# Patient Record
Sex: Male | Born: 1990 | Race: Black or African American | Hispanic: No | Marital: Single | Smoking: Current every day smoker
Health system: Southern US, Community
[De-identification: ages and names within clinical notes are randomized; demographics above are authoritative.]

## PROBLEM LIST (undated history)

## (undated) HISTORY — PX: CARDIAC SURGERY: SHX584

---
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
## 2014-05-09 ENCOUNTER — Emergency Department (HOSPITAL_COMMUNITY)
Admission: EM | Admit: 2014-05-09 | Discharge: 2014-05-09 | Disposition: A | Discharge status: Home / Self Care | Payer: No Typology Code available for payment source | Attending: Emergency Medicine | Admitting: Emergency Medicine

## 2014-05-09 ENCOUNTER — Encounter (HOSPITAL_COMMUNITY): Payer: Self-pay | Admitting: Family Medicine

## 2014-05-09 DIAGNOSIS — S3992XA Unspecified injury of lower back, initial encounter: Secondary | ICD-10-CM | POA: Diagnosis present

## 2014-05-09 DIAGNOSIS — M545 Low back pain, unspecified: Secondary | ICD-10-CM

## 2014-05-09 DIAGNOSIS — Y9389 Activity, other specified: Secondary | ICD-10-CM | POA: Insufficient documentation

## 2014-05-09 DIAGNOSIS — Y92410 Unspecified street and highway as the place of occurrence of the external cause: Secondary | ICD-10-CM | POA: Insufficient documentation

## 2014-05-09 DIAGNOSIS — Z720 Tobacco use: Secondary | ICD-10-CM | POA: Insufficient documentation

## 2014-05-09 DIAGNOSIS — Y998 Other external cause status: Secondary | ICD-10-CM | POA: Diagnosis not present

## 2014-05-09 DIAGNOSIS — Z9889 Other specified postprocedural states: Secondary | ICD-10-CM | POA: Insufficient documentation

## 2014-05-09 MED ORDER — TRAMADOL HCL 50 MG PO TABS: 50.0000 mg | ORAL_TABLET | Freq: Four times a day (QID) | ORAL | Status: DC | PRN

## 2014-05-09 MED ORDER — IBUPROFEN 600 MG PO TABS: 600.0000 mg | ORAL_TABLET | Freq: Four times a day (QID) | ORAL | Status: DC | PRN

## 2014-05-09 MED ORDER — CYCLOBENZAPRINE HCL 10 MG PO TABS: 10.0000 mg | ORAL_TABLET | Freq: Two times a day (BID) | ORAL | Status: DC | PRN

## 2014-05-09 NOTE — ED Notes (Signed)
Pt made aware to return if symptoms worsen or if any life threatening symptoms occur.   
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
## 2014-08-31 ENCOUNTER — Emergency Department (HOSPITAL_COMMUNITY)
Admission: EM | Admit: 2014-08-31 | Discharge: 2014-08-31 | Disposition: A | Discharge status: Home / Self Care | Payer: Self-pay | Attending: Emergency Medicine | Admitting: Emergency Medicine

## 2014-08-31 ENCOUNTER — Encounter (HOSPITAL_COMMUNITY): Payer: Self-pay | Admitting: *Deleted

## 2014-08-31 DIAGNOSIS — Y9289 Other specified places as the place of occurrence of the external cause: Secondary | ICD-10-CM | POA: Insufficient documentation

## 2014-08-31 DIAGNOSIS — S41012A Laceration without foreign body of left shoulder, initial encounter: Secondary | ICD-10-CM | POA: Insufficient documentation

## 2014-08-31 DIAGNOSIS — Y998 Other external cause status: Secondary | ICD-10-CM | POA: Insufficient documentation

## 2014-08-31 DIAGNOSIS — S21212A Laceration without foreign body of left back wall of thorax without penetration into thoracic cavity, initial encounter: Secondary | ICD-10-CM

## 2014-08-31 DIAGNOSIS — Y9389 Activity, other specified: Secondary | ICD-10-CM | POA: Insufficient documentation

## 2014-08-31 DIAGNOSIS — S21112A Laceration without foreign body of left front wall of thorax without penetration into thoracic cavity, initial encounter: Secondary | ICD-10-CM | POA: Insufficient documentation

## 2014-08-31 DIAGNOSIS — S31119A Laceration without foreign body of abdominal wall, unspecified quadrant without penetration into peritoneal cavity, initial encounter: Secondary | ICD-10-CM | POA: Insufficient documentation

## 2014-08-31 DIAGNOSIS — Z720 Tobacco use: Secondary | ICD-10-CM | POA: Insufficient documentation

## 2014-08-31 NOTE — Discharge Instructions (Signed)
Keep wounds clean and dry. Let the sterri strips fall of on their own. Follow up with your doctor as needed.   Sterile Tape Wound Care Some cuts and wounds can be closed using sterile tape, also called skin adhesive strips. Skin adhesive strips can be used for shallow (superficial) and simple cuts, wounds, lacerations, and surgical incisions. These strips act in place of stitches to hold the edges of the wound together, allowing for faster healing. Unlike stitches, the adhesive strips do not require needles or anesthetic medicine for placement. The strips will wear off naturally as the wound is healing. It is important to take proper care of your wound at home while it heals.  HOME CARE INSTRUCTIONS  Try to keep the area around your wound clean and dry. Do not allow the adhesive strips to get wet for the first 12 hours.   Do not use any soaps or ointments on the wound for the first 12 hours.   If a bandage (dressing) has been applied, follow your health care provider's instructions for how often to change the dressing. Keep the dressing dry if one has been applied.   Do not remove the adhesive strips. They will fall off on their own. If they do not, you may remove them gently after 10 days. You should gently wet the strips before removing them. For example, this can be done in the shower.  Do not scratch, pick, or rub the wound area.   Protect the wound from further injury until it is healed.   Protect the wound from sun and tanning bed exposure while it is healing and for several weeks after healing.   Only take over-the-counter or prescription medicines as directed by your health care provider.   Keep all follow-up appointments as directed by your health care provider.  SEEK MEDICAL CARE IF: Your adhesive strips become wet or soaked with blood before the wound has healed. The tape will need to be replaced.  SEEK IMMEDIATE MEDICAL CARE IF:  You have increasing pain in the wound.    You develop a rash after the strips are applied.  Your wound becomes red, swollen, hot, or tender.   You have a red streak that goes away from the wound.   You have pus coming from the wound.   You have increased bleeding from the wound.  You notice a bad smell coming from the wound.   Your wound breaks open. MAKE SURE YOU:  Understand these instructions.  Will watch your condition.  Will get help right away if you are not doing well or get worse. Document Released: 03/14/2004 Document Revised: 11/25/2012 Document Reviewed: 08/26/2012 [facility] Patient Information 2015 [employer], [place]. This information is not intended to replace advice given to you by your health care provider. Make sure you discuss any questions you have with your health care provider.
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
## 2014-10-26 ENCOUNTER — Emergency Department (HOSPITAL_COMMUNITY)
Admission: EM | Admit: 2014-10-26 | Discharge: 2014-10-26 | Disposition: A | Discharge status: Home / Self Care | Payer: Self-pay | Attending: Emergency Medicine | Admitting: Emergency Medicine

## 2014-10-26 ENCOUNTER — Encounter (HOSPITAL_COMMUNITY): Payer: Self-pay | Admitting: *Deleted

## 2014-10-26 ENCOUNTER — Emergency Department (HOSPITAL_COMMUNITY): Payer: Self-pay

## 2014-10-26 DIAGNOSIS — W458XXA Other foreign body or object entering through skin, initial encounter: Secondary | ICD-10-CM | POA: Insufficient documentation

## 2014-10-26 DIAGNOSIS — Y998 Other external cause status: Secondary | ICD-10-CM | POA: Insufficient documentation

## 2014-10-26 DIAGNOSIS — M795 Residual foreign body in soft tissue: Secondary | ICD-10-CM

## 2014-10-26 DIAGNOSIS — Y9289 Other specified places as the place of occurrence of the external cause: Secondary | ICD-10-CM | POA: Insufficient documentation

## 2014-10-26 DIAGNOSIS — Y9389 Activity, other specified: Secondary | ICD-10-CM | POA: Insufficient documentation

## 2014-10-26 DIAGNOSIS — Z720 Tobacco use: Secondary | ICD-10-CM | POA: Insufficient documentation

## 2014-10-26 DIAGNOSIS — S51821A Laceration with foreign body of right forearm, initial encounter: Secondary | ICD-10-CM | POA: Insufficient documentation

## 2014-10-26 MED ORDER — LIDOCAINE HCL 2 % IJ SOLN
10.0000 mL | Freq: Once | INTRAMUSCULAR | Status: AC
  Administered 2014-10-26: 200 mg
  Filled 2014-10-26: qty 20

## 2014-10-26 MED ORDER — LIDOCAINE HCL 1 % IJ SOLN
20.0000 mL | Freq: Once | INTRAMUSCULAR | Status: DC
  Filled 2014-10-26: qty 20

## 2014-10-26 MED ORDER — CEPHALEXIN 500 MG PO CAPS: 500.0000 mg | ORAL_CAPSULE | Freq: Two times a day (BID) | ORAL | Status: DC

## 2014-10-26 NOTE — ED Notes (Signed)
PT reports when he was moving wood a piece became lodged in hi anterior fore arm.

## 2014-10-26 NOTE — ED Notes (Signed)
Declined W/C at D/C and was escorted to lobby by RN. 

## 2014-10-26 NOTE — ED Provider Notes (Signed)
CSN: [id number]     Arrival date & time 10/26/14  4098 History  This chart was scribed for non-physician practitioner, Eyvonne Mechanic, PA-C working with Azalia Bilis, MD by Gwenyth Ober, ED scribe. This patient was seen in room TR06C/TR06C and the patient's care was started at 10:11 AM   Chief Complaint  Patient presents with  . Arm Injury   The history is provided by the patient. No language interpreter was used.   HPI Comments: Shawn Dunlap is a 24 y.o. male who presents to the Emergency Department complaining of a piece of wood lodged in his distal right forearm that occurred 1 hour ago. Pt reports injury occurred while he was moving a piece of plywood in his yard. He had a tetanus vaccine 2 years ago. Pt does not take any regular medication. He denies a history of DM. Pt also denies numbness as an associated symptom.  No PCP  History reviewed. No pertinent past medical history. Past Surgical History  Procedure Laterality Date  . 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   History reviewed. No pertinent family history. Social History  Substance Use Topics  . Smoking status: Current Every Day Smoker    Types: Cigarettes  . Smokeless tobacco: Never Used  . Alcohol Use: No    Review of Systems  All other systems reviewed and are negative.     Allergies  Review of patient's allergies indicates no known allergies.  Home Medications   Prior to Admission medications   Medication Sig Start Date End Date Taking? Authorizing Provider  cephALEXin (KEFLEX) 500 MG capsule Take 1 capsule (500 mg total) by mouth 2 (two) times daily. 10/26/14   Eyvonne Mechanic, PA-C  cyclobenzaprine (FLEXERIL) 10 MG tablet Take 1 tablet (10 mg total) by mouth 2 (two) times daily as needed for muscle spasms. 05/09/14   Junius Finner, PA-C  ibuprofen (ADVIL,MOTRIN) 600 MG tablet Take 1 tablet (600 mg total) by mouth every 6 (six) hours as needed. 05/09/14   Junius Finner, PA-C   traMADol (ULTRAM) 50 MG tablet Take 1 tablet (50 mg total) by mouth every 6 (six) hours as needed. 05/09/14   Junius Finner, PA-C   BP 120/75 mmHg  Pulse 51  Temp(Src) 97.6 F (36.4 C) (Oral)  Resp 16  SpO2 100%   Physical Exam  Constitutional: He appears well-developed and well-nourished. No distress.  HENT:  Head: Normocephalic and atraumatic.  Eyes: Conjunctivae and EOM are normal.  Neck: Neck supple. No tracheal deviation present.  Cardiovascular: Normal rate.   Pulmonary/Chest: Effort normal. No respiratory distress.  Musculoskeletal:  2-1/2 cm splinter in right distal wrist, no obvious bleeding, no obvious signs of infection. Distal sensation intact, cap refill less than 3 seconds, good strength 5 out of 5  Skin: Skin is warm and dry.  Psychiatric: He has a normal mood and affect. His behavior is normal.  Nursing note and vitals reviewed.   ED Course  Procedures   DIAGNOSTIC STUDIES: Oxygen Saturation is 99% on RA, normal by my interpretation.    COORDINATION OF CARE: 10:15 AM Discussed treatment plan with pt which includes removal of the foreign body. Pt agreed to plan.  11:28 AM  REMOVAL OF A FOREIGN BODY Performed by: Eyvonne Mechanic, PA-C Consent: Verbal consent obtained. Risks and benefits: risks, benefits and alternatives were discussed Patient identity confirmed: provided demographic data Time out performed prior to procedure Prepped and Draped in  normal sterile fashion Wound explored, foreign body (wood) identified Location: Right distal forearm Length: 2.5 cm Anesthesia: local infiltration Local anesthetic: lidocaine 2% without epinephrine Anesthetic total: 2 ml Irrigation method: syringe Amount of cleaning: standard Technique: 1 cm incision made, entirety of foreign body grasped with scissors and removed. No remaining foreign bodies visualized or palpated.  Patient tolerance: Patient tolerated the procedure well with no immediate  complications.  11:52 AM LACERATION REPAIR Performed by: Eyvonne Mechanic, PA-C Consent: Verbal consent obtained. Risks and benefits: risks, benefits and alternatives were discussed Patient identity confirmed: provided demographic data Time out performed prior to procedure Prepped and Draped in normal sterile fashion Wound explored Laceration Location: Right distal forearm Laceration Length: 1 cm No Foreign Bodies seen or palpated Anesthesia: local infiltration Local anesthetic: lidocaine 2% without epinephrine (administered during foreign body removal) Irrigation method: syringe Amount of cleaning: standard Skin closure: Edges well-approximated Number of sutures or staples: 3 4-0 Vicryl Rapide Technique: Simple interrupted Patient tolerance: Patient tolerated the procedure well with no immediate complications.   Labs Review Labs Reviewed - No data to display  Imaging Review Dg Wrist Complete Right  10/26/2014   CLINICAL DATA:  Penetrating injury with wood.  Rule out foreign body  EXAM: RIGHT WRIST - COMPLETE 3+ VIEW  COMPARISON:  None.  FINDINGS: Normal alignment no fracture. No radiopaque foreign body. There is mild soft tissue swelling ventrally proximal to the wrist joint which could be hematoma. Note that wood may be impossible see on x-ray.  IMPRESSION: Negative for foreign body.  No significant bone abnormality.   Electronically Signed   By: Marlan Palau M.D.   On: 10/26/2014 13:10     EKG Interpretation None      MDM   Final diagnoses:  Foreign body (FB) in soft tissue   Labs:    Imaging: X-ray right wrist  Consults:   Therapeutics: Lidocaine  Discharge Meds: Keflex  Assessment/Plan: Patient presents with a foreign body, minor incision was made to remove the foreign body, plain film showed no remaining foreign body. Patient was sutured, placed on prophylactic antibiotics. He is given strict return precautions.   I personally performed the services  described in this documentation, which was scribed in my presence. The recorded information has been reviewed and is accurate.    Eyvonne Mechanic, PA-C 10/26/14 1505  Azalia Bilis, MD 10/26/14 [phone]

## 2014-10-26 NOTE — Discharge Instructions (Signed)
Please monitor for signs of infection, return immediately if any present. Please use antibiotics as directed.

## 2014-11-10 ENCOUNTER — Emergency Department (HOSPITAL_COMMUNITY)
Admission: EM | Admit: 2014-11-10 | Discharge: 2014-11-11 | Disposition: A | Discharge status: Home / Self Care | Payer: No Typology Code available for payment source | Attending: Emergency Medicine | Admitting: Emergency Medicine

## 2014-11-10 ENCOUNTER — Encounter (HOSPITAL_COMMUNITY): Payer: Self-pay | Admitting: *Deleted

## 2014-11-10 DIAGNOSIS — M7918 Myalgia, other site: Secondary | ICD-10-CM

## 2014-11-10 DIAGNOSIS — Y92410 Unspecified street and highway as the place of occurrence of the external cause: Secondary | ICD-10-CM | POA: Diagnosis not present

## 2014-11-10 DIAGNOSIS — Y9389 Activity, other specified: Secondary | ICD-10-CM | POA: Diagnosis not present

## 2014-11-10 DIAGNOSIS — Z792 Long term (current) use of antibiotics: Secondary | ICD-10-CM | POA: Diagnosis not present

## 2014-11-10 DIAGNOSIS — Z720 Tobacco use: Secondary | ICD-10-CM | POA: Diagnosis not present

## 2014-11-10 DIAGNOSIS — S4991XA Unspecified injury of right shoulder and upper arm, initial encounter: Secondary | ICD-10-CM | POA: Insufficient documentation

## 2014-11-10 DIAGNOSIS — S0990XA Unspecified injury of head, initial encounter: Secondary | ICD-10-CM | POA: Diagnosis not present

## 2014-11-10 DIAGNOSIS — S8992XA Unspecified injury of left lower leg, initial encounter: Secondary | ICD-10-CM | POA: Insufficient documentation

## 2014-11-10 DIAGNOSIS — Z9889 Other specified postprocedural states: Secondary | ICD-10-CM | POA: Diagnosis not present

## 2014-11-10 DIAGNOSIS — Y999 Unspecified external cause status: Secondary | ICD-10-CM | POA: Insufficient documentation

## 2014-11-10 MED ORDER — METHOCARBAMOL 500 MG PO TABS: 500.0000 mg | ORAL_TABLET | Freq: Two times a day (BID) | ORAL | Status: DC

## 2014-11-10 MED ORDER — HYDROCODONE-ACETAMINOPHEN 5-325 MG PO TABS
2.0000 | ORAL_TABLET | Freq: Once | ORAL | Status: AC
  Administered 2014-11-11: 2 via ORAL
  Filled 2014-11-10: qty 2

## 2014-11-10 MED ORDER — CYCLOBENZAPRINE HCL 10 MG PO TABS
5.0000 mg | ORAL_TABLET | Freq: Once | ORAL | Status: AC
  Administered 2014-11-11: 5 mg via ORAL
  Filled 2014-11-10: qty 1

## 2014-11-10 MED ORDER — NAPROXEN 500 MG PO TABS: 500.0000 mg | ORAL_TABLET | Freq: Two times a day (BID) | ORAL | Status: DC
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
## 2014-11-10 NOTE — ED Notes (Signed)
PA at bedside.
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
## 2017-11-03 ENCOUNTER — Encounter (HOSPITAL_COMMUNITY): Payer: Self-pay

## 2017-11-03 ENCOUNTER — Other Ambulatory Visit: Payer: Self-pay

## 2017-11-03 ENCOUNTER — Emergency Department (HOSPITAL_COMMUNITY)
Admission: EM | Admit: 2017-11-03 | Discharge: 2017-11-03 | Disposition: A | Discharge status: Home / Self Care | Payer: No Typology Code available for payment source | Attending: Emergency Medicine | Admitting: Emergency Medicine

## 2017-11-03 DIAGNOSIS — Y929 Unspecified place or not applicable: Secondary | ICD-10-CM | POA: Insufficient documentation

## 2017-11-03 DIAGNOSIS — Z79899 Other long term (current) drug therapy: Secondary | ICD-10-CM | POA: Diagnosis not present

## 2017-11-03 DIAGNOSIS — Y999 Unspecified external cause status: Secondary | ICD-10-CM | POA: Diagnosis not present

## 2017-11-03 DIAGNOSIS — F17210 Nicotine dependence, cigarettes, uncomplicated: Secondary | ICD-10-CM | POA: Insufficient documentation

## 2017-11-03 DIAGNOSIS — S39012A Strain of muscle, fascia and tendon of lower back, initial encounter: Secondary | ICD-10-CM | POA: Insufficient documentation

## 2017-11-03 DIAGNOSIS — Y9389 Activity, other specified: Secondary | ICD-10-CM | POA: Diagnosis not present

## 2017-11-03 MED ORDER — ORPHENADRINE CITRATE ER 100 MG PO TB12: 100.0000 mg | ORAL_TABLET | Freq: Two times a day (BID) | ORAL | 0 refills | Status: AC

## 2017-11-03 MED ORDER — MELOXICAM 7.5 MG PO TABS: 7.5000 mg | ORAL_TABLET | Freq: Every day | ORAL | 0 refills | Status: AC

## 2017-11-03 NOTE — Discharge Instructions (Addendum)
Take meloxicam and Norflex as needed as prescribed for muscle soreness.  Do not drive if taking Norflex. Apply warm compresses to sore muscles for 20 minutes at a time. Follow-up with primary care provider, referral given if needed.
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
## 2020-07-12 ENCOUNTER — Ambulatory Visit (INDEPENDENT_AMBULATORY_CARE_PROVIDER_SITE_OTHER): Payer: Self-pay

## 2020-07-12 ENCOUNTER — Other Ambulatory Visit: Payer: Self-pay

## 2020-07-12 ENCOUNTER — Ambulatory Visit (HOSPITAL_COMMUNITY)
Admission: EM | Admit: 2020-07-12 | Discharge: 2020-07-12 | Disposition: A | Discharge status: Home / Self Care | Payer: Self-pay | Attending: Emergency Medicine | Admitting: Emergency Medicine

## 2020-07-12 ENCOUNTER — Encounter (HOSPITAL_COMMUNITY): Payer: Self-pay

## 2020-07-12 DIAGNOSIS — L03032 Cellulitis of left toe: Secondary | ICD-10-CM

## 2020-07-12 DIAGNOSIS — S91302A Unspecified open wound, left foot, initial encounter: Secondary | ICD-10-CM

## 2020-07-12 DIAGNOSIS — W208XXA Other cause of strike by thrown, projected or falling object, initial encounter: Secondary | ICD-10-CM

## 2020-07-12 DIAGNOSIS — S9032XA Contusion of left foot, initial encounter: Secondary | ICD-10-CM

## 2020-07-12 DIAGNOSIS — M79672 Pain in left foot: Secondary | ICD-10-CM

## 2020-07-12 MED ORDER — TRAMADOL HCL 50 MG PO TABS: 50.0000 mg | ORAL_TABLET | Freq: Four times a day (QID) | ORAL | 0 refills | Status: DC | PRN

## 2020-07-12 MED ORDER — CEPHALEXIN 500 MG PO CAPS: 500.0000 mg | ORAL_CAPSULE | Freq: Two times a day (BID) | ORAL | 0 refills | Status: DC

## 2020-07-12 MED ORDER — CEPHALEXIN 500 MG PO CAPS: 500.0000 mg | ORAL_CAPSULE | Freq: Four times a day (QID) | ORAL | 0 refills | Status: DC

## 2020-07-12 NOTE — Discharge Instructions (Signed)
You will need to follow up with podiatry for foot injury  We do not see an fracture to foot but you do have a piece of metal in the area of injury.  You will need to take antibiotics full dose if redness is not better in 48 hours you will need to return to urgent care for follow up  Clean area wash dry keep covered  Wear boot for 7 days or until healed
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
## 2020-07-12 NOTE — ED Triage Notes (Signed)
Pt reports swelling, pain in the left foot x 3-4 days after a table fell over the left foot.  Pt states some skin ripped off when the table smash his foot.

## 2020-08-10 ENCOUNTER — Emergency Department (HOSPITAL_COMMUNITY): Payer: Self-pay

## 2020-08-10 ENCOUNTER — Encounter (HOSPITAL_COMMUNITY): Payer: Self-pay | Admitting: *Deleted

## 2020-08-10 ENCOUNTER — Emergency Department (HOSPITAL_COMMUNITY)
Admission: EM | Admit: 2020-08-10 | Discharge: 2020-08-10 | Disposition: A | Discharge status: Home / Self Care | Payer: Self-pay | Attending: Emergency Medicine | Admitting: Emergency Medicine

## 2020-08-10 ENCOUNTER — Other Ambulatory Visit: Payer: Self-pay

## 2020-08-10 DIAGNOSIS — W3400XA Accidental discharge from unspecified firearms or gun, initial encounter: Secondary | ICD-10-CM | POA: Insufficient documentation

## 2020-08-10 DIAGNOSIS — S91102A Unspecified open wound of left great toe without damage to nail, initial encounter: Secondary | ICD-10-CM | POA: Insufficient documentation

## 2020-08-10 DIAGNOSIS — F17210 Nicotine dependence, cigarettes, uncomplicated: Secondary | ICD-10-CM | POA: Insufficient documentation

## 2020-08-10 DIAGNOSIS — Z23 Encounter for immunization: Secondary | ICD-10-CM | POA: Insufficient documentation

## 2020-08-10 DIAGNOSIS — S91132A Puncture wound without foreign body of left great toe without damage to nail, initial encounter: Secondary | ICD-10-CM

## 2020-08-10 MED ORDER — TETANUS-DIPHTH-ACELL PERTUSSIS 5-2.5-18.5 LF-MCG/0.5 IM SUSY
0.5000 mL | PREFILLED_SYRINGE | Freq: Once | INTRAMUSCULAR | Status: AC
Start: 2020-08-10 — End: 2020-08-10
  Administered 2020-08-10: 0.5 mL via INTRAMUSCULAR
  Filled 2020-08-10: qty 0.5

## 2020-08-10 MED ORDER — LIDOCAINE HCL (PF) 1 % IJ SOLN
10.0000 mL | Freq: Once | INTRAMUSCULAR | Status: AC
Start: 2020-08-10 — End: 2020-08-10
  Administered 2020-08-10: 10 mL
  Filled 2020-08-10: qty 30

## 2020-08-10 MED ORDER — ACETAMINOPHEN 500 MG PO TABS
1000.0000 mg | ORAL_TABLET | Freq: Once | ORAL | Status: AC
  Administered 2020-08-10: 1000 mg via ORAL
  Filled 2020-08-10: qty 2

## 2020-08-10 MED ORDER — BACITRACIN ZINC 500 UNIT/GM EX OINT
TOPICAL_OINTMENT | Freq: Two times a day (BID) | CUTANEOUS | Status: DC
  Filled 2020-08-10: qty 0.9

## 2020-08-10 MED ORDER — CEPHALEXIN 500 MG PO CAPS: 500.0000 mg | ORAL_CAPSULE | Freq: Three times a day (TID) | ORAL | 0 refills | Status: AC
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
## 2022-06-27 ENCOUNTER — Ambulatory Visit (HOSPITAL_COMMUNITY)
Admission: EM | Admit: 2022-06-27 | Discharge: 2022-06-27 | Disposition: A | Discharge status: Home / Self Care | Payer: Self-pay | Attending: Family Medicine | Admitting: Family Medicine

## 2022-06-27 ENCOUNTER — Other Ambulatory Visit: Payer: Self-pay

## 2022-06-27 ENCOUNTER — Ambulatory Visit (INDEPENDENT_AMBULATORY_CARE_PROVIDER_SITE_OTHER): Payer: Self-pay

## 2022-06-27 ENCOUNTER — Encounter (HOSPITAL_COMMUNITY): Payer: Self-pay | Admitting: Emergency Medicine

## 2022-06-27 DIAGNOSIS — S61239A Puncture wound without foreign body of unspecified finger without damage to nail, initial encounter: Secondary | ICD-10-CM

## 2022-06-27 DIAGNOSIS — M79645 Pain in left finger(s): Secondary | ICD-10-CM

## 2022-06-27 MED ORDER — IBUPROFEN 800 MG PO TABS: 800.0000 mg | ORAL_TABLET | Freq: Three times a day (TID) | ORAL | 0 refills | Status: DC

## 2022-06-27 MED ORDER — CEPHALEXIN 500 MG PO CAPS: 500.0000 mg | ORAL_CAPSULE | Freq: Two times a day (BID) | ORAL | 0 refills | Status: DC

## 2022-06-27 NOTE — ED Provider Notes (Signed)
[facility]   [id number] 06/27/22 Arrival Time: 1708  ASSESSMENT & PLAN:  1. Pain in finger of left hand   2. Puncture wound of finger, initial encounter    I have personally viewed and independently interpreted the imaging studies ordered this visit. No bony abnormality to L 2nd finger films.  Discharge Medication List as of 06/27/2022  6:18 PM     START taking these medications   Details  cephALEXin (KEFLEX) 500 MG capsule Take 1 capsule (500 mg total) by mouth 2 (two) times daily., Starting Thu 06/27/2022, Normal      Empiric tx to avoid infection given puncture through and through wound.  Orders Placed This Encounter  Procedures   DG Finger Index Left   Work/school excuse note: provided. Recommend:  Follow-up Information     [facility].   Specialty: Urgent Care Why: If worsening or failing to improve as anticipated. Contact information: [address] [place] [address] [phone]                Reviewed expectations re: course of current medical issues. Questions answered. Outlined signs and symptoms indicating need for more acute intervention. Patient verbalized understanding. After Visit Summary given.  SUBJECTIVE: History from: patient. Shawn Dunlap is a 32 y.o. male who reports Left index finger injury..  patient reports he accidentally drove a nail into left index finger and had to drill to removed from finger.  2 sites, one entry site to pad of finger and exit through finger nail.  Patient was hanging dry wall.    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      OBJECTIVE:  Vitals:   06/27/22 1732  BP: 133/80  Pulse: 65  Resp: 18  Temp: 98.1 F (36.7 C)  TempSrc: Oral  SpO2: 97%    General appearance: alert; no distress HEENT: [place]; AT Neck: supple with  FROM Resp: unlabored respirations Extremities: L 2nd finger: warm with well perfused appearance; through and through puncture wound to distal left 2nd finger; does involve nail that is still intact; finger ROM: normal CV: brisk extremity capillary refill of LUE; 2+ radial pulse of LUE. Skin: warm and dry; no visible rashes Neurologic: gait normal; normal sensation and strength of LUE Psychological: alert and cooperative; normal mood and affect  Imaging: DG Finger Index Left  Result Date: 06/27/2022 CLINICAL DATA:  Trauma, nail through finger EXAM: LEFT INDEX FINGER 2+V COMPARISON:  None Available. FINDINGS: Frontal, oblique, lateral views of the left second digit are obtained. No acute displaced fracture, subluxation, or dislocation. No radiopaque foreign bodies. Joint spaces are well preserved. Soft tissues are grossly unremarkable. IMPRESSION: 1. No fracture or radiopaque foreign body. Electronically Signed   By: Sharlet Salina M.D.   On: 06/27/2022 18:06      No Known Allergies  History reviewed. No pertinent past medical history. Social History   Socioeconomic History   Marital status: Single    Spouse name: Not on file   Number of children: Not on file   Years of education: Not on file   Highest education level: Not on file  Occupational History   Not on file  Tobacco Use   Smoking status: Every Day    Types: Cigarettes   Smokeless tobacco: Never  Vaping Use   Vaping  Use: Every day  Substance and Sexual Activity   Alcohol use: Not Currently   Drug use: No   Sexual activity: Not on file  Other Topics Concern   Not on file  Social History Narrative   Not on file   Social Determinants of Health   Financial Resource Strain: Not on file  Food Insecurity: Not on file  Transportation Needs: Not on file  Physical Activity: Not on file  Stress: Not on file  Social Connections: Not on file   Family History  Problem Relation Age of Onset   Healthy Mother    Past  Surgical History:  Procedure Laterality Date   CARDIAC SURGERY     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       Mardella Layman, MD 06/27/22 2008

## 2022-06-27 NOTE — ED Triage Notes (Addendum)
Left index finger injury..  patient reports he accidentally drove a nail into left index finger and had to drill to removed from finger.  2 sites, one entry site to pad of finger and exit through finger nail.  Patient was hanging dry wall.    Last tetanus was February 2023  Patient has a random complaint of right shoulder soreness after shooting basketballs

## 2023-11-11 ENCOUNTER — Ambulatory Visit
Admission: EM | Admit: 2023-11-11 | Discharge: 2023-11-11 | Disposition: A | Discharge status: Home / Self Care | Attending: Family Medicine | Admitting: Family Medicine

## 2023-11-11 DIAGNOSIS — L03315 Cellulitis of perineum: Secondary | ICD-10-CM

## 2023-11-11 MED ORDER — AMOXICILLIN-POT CLAVULANATE 875-125 MG PO TABS: 1.0000 | ORAL_TABLET | Freq: Two times a day (BID) | ORAL | 0 refills | Status: DC

## 2023-11-11 MED ORDER — IBUPROFEN 800 MG PO TABS: 800.0000 mg | ORAL_TABLET | Freq: Three times a day (TID) | ORAL | 0 refills | Status: DC | PRN

## 2023-11-11 MED ORDER — IBUPROFEN 800 MG PO TABS: 800.0000 mg | ORAL_TABLET | Freq: Three times a day (TID) | ORAL | 0 refills | Status: AC | PRN

## 2023-11-11 NOTE — ED Notes (Signed)
 Patient undressed for provider

## 2023-11-11 NOTE — ED Provider Notes (Signed)
 [facility]    CSN: [id number] Arrival date & time: 11/11/23  1532      History   Chief Complaint Chief Complaint  Patient presents with   Skin Problem    HPI Shawn Dunlap is a 33 y.o. male.   HPI Here for 3 to 4-day history of pain in his perineum.  It is gotten worse since it began.  No fever and no drainage from the area.  NKDA   History reviewed. No pertinent past medical history.  There are no active problems to display for this patient.   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       Home Medications    Prior to Admission medications   Medication Sig Start Date End Date Taking? Authorizing Provider  amoxicillin -clavulanate (AUGMENTIN ) 875-125 MG tablet Take 1 tablet by mouth 2 (two) times daily for 7 days. 11/11/23 11/18/23  Vonna Sharlet POUR, MD  ibuprofen  (ADVIL ) 800 MG tablet Take 1 tablet (800 mg total) by mouth every 8 (eight) hours as needed (pain). 11/11/23   Vonna Sharlet POUR, MD    Family History Family History  Problem Relation Age of Onset   Healthy Mother     Social History Social History   Tobacco Use   Smoking status: Former    Types: Cigarettes   Smokeless tobacco: Never  Vaping Use   Vaping status: Every Day   Substances: Nicotine, Flavoring  Substance Use Topics   Alcohol use: Not Currently   Drug use: No     Allergies   Patient has no known allergies.   Review of Systems Review of Systems   Physical Exam Triage Vital Signs ED Triage Vitals  Encounter Vitals Group     BP 11/11/23 1716 112/79     Girls Systolic BP Percentile --      Girls Diastolic BP Percentile --      Boys Systolic BP Percentile --      Boys Diastolic BP Percentile --      Pulse Rate 11/11/23 1716 77     Resp 11/11/23 1716 18     Temp 11/11/23 1716 97.9 F (36.6 C)     Temp Source 11/11/23 1716 Oral     SpO2 11/11/23 1716 97 %     Weight  11/11/23 1715 195 lb (88.5 kg)     Height 11/11/23 1715 5' 10 (1.778 m)     Head Circumference --      Peak Flow --      Pain Score 11/11/23 1714 10     Pain Loc --      Pain Education --      Exclude from Growth Chart --    No data found.  Updated Vital Signs BP 112/79 (BP Location: Left Arm)   Pulse 77   Temp 97.9 F (36.6 C) (Oral)   Resp 18   Ht 5' 10 (1.778 m)   Wt 88.5 kg   SpO2 97%   BMI 27.98 kg/m   Visual Acuity Right Eye Distance:   Left Eye Distance:   Bilateral Distance:    Right Eye Near:   Left Eye Near:    Bilateral Near:     Physical Exam Vitals reviewed.  Constitutional:      General: He is not in acute distress.    Appearance: He is not ill-appearing, toxic-appearing or diaphoretic.  Genitourinary:    Comments: Chaperone is present during the time of exam.  There is induration and tenderness and erythema about 4 cm in diameter just posterior to the scrotum on the left side.  There is possibly some fluctuance in the anterior portion. Skin:    Coloration: Skin is not jaundiced or pale.  Neurological:     General: No focal deficit present.     Mental Status: He is alert and oriented to person, place, and time.  Psychiatric:        Behavior: Behavior normal.      UC Treatments / Results  Labs (all labs ordered are listed, but only abnormal results are displayed) Labs Reviewed - No data to display  EKG   Radiology No results found.  Procedures Procedures (including critical care time)  Medications Ordered in UC Medications - No data to display  Initial Impression / Assessment and Plan / UC Course  I have reviewed the triage vital signs and the nursing notes.  Pertinent labs & imaging results that were available during my care of the patient were reviewed by me and considered in my medical decision making (see chart for details).     We discussed risks and benefits of drainage procedure.  We decided to try draining the fluctuant  area.  He gave verbal consent Chaperone was present during the entire procedure.  Under clean conditions 1% lidocaine  plain is used to put infiltration anesthesia in.  #11 blade is used to make a stab wound in the anterior portion that was fluctuant.  Only blood was obtained. EBL 1 mL.  No complications.  Gauze was applied for pressure initially and then gauze was provided to him to put in his underwear to make a dressing.  Augmentin  and ibuprofen  are sent in for the infection and symptoms.  Final Clinical Impressions(s) / UC Diagnoses   Final diagnoses:  Cellulitis of perineum     Discharge Instructions      Take amoxicillin -clavulanate 875 mg--1 tab twice daily with food for 7 days   Take ibuprofen  800 mg--1 tab every 8 hours as needed for pain.  Warm sitz baths can help the circulation in the area  Ice actually might help the pain and turn    ED Prescriptions     Medication Sig Dispense Auth. Provider   ibuprofen  (ADVIL ) 800 MG tablet  (Status: Discontinued) Take 1 tablet (800 mg total) by mouth every 8 (eight) hours as needed (pain). 21 tablet Clydette Privitera K, MD   amoxicillin -clavulanate (AUGMENTIN ) 875-125 MG tablet  (Status: Discontinued) Take 1 tablet by mouth 2 (two) times daily for 7 days. 14 tablet Maritza Hosterman K, MD   amoxicillin -clavulanate (AUGMENTIN ) 875-125 MG tablet Take 1 tablet by mouth 2 (two) times daily for 7 days. 14 tablet Kamyrah Feeser, Sharlet POUR, MD   ibuprofen  (ADVIL ) 800 MG tablet Take 1 tablet (800 mg total) by mouth every 8 (eight) hours as needed (pain). 21 tablet Cristian Grieves K, MD      PDMP not reviewed this encounter.   Vonna Sharlet POUR, MD 11/11/23 2038

## 2023-11-11 NOTE — Discharge Instructions (Signed)
 Take amoxicillin -clavulanate 875 mg--1 tab twice daily with food for 7 days   Take ibuprofen  800 mg--1 tab every 8 hours as needed for pain.  Warm sitz baths can help the circulation in the area  Ice actually might help the pain and turn

## 2023-11-11 NOTE — ED Triage Notes (Signed)
 Attempted to call patient from care, no answer.

## 2023-11-11 NOTE — ED Triage Notes (Signed)
 Patient reports boil or abscess just below left cheek/buttocks. No fever. Has had this once before but was able to treat it at home and it went away.

## 2023-11-13 ENCOUNTER — Inpatient Hospital Stay (HOSPITAL_BASED_OUTPATIENT_CLINIC_OR_DEPARTMENT_OTHER)
Admission: EM | Admit: 2023-11-13 | Discharge: 2023-11-14 | DRG: 603 | Disposition: A | Discharge status: Home / Self Care | Attending: Surgery | Admitting: Surgery

## 2023-11-13 ENCOUNTER — Emergency Department (HOSPITAL_BASED_OUTPATIENT_CLINIC_OR_DEPARTMENT_OTHER)

## 2023-11-13 DIAGNOSIS — L02215 Cutaneous abscess of perineum: Secondary | ICD-10-CM | POA: Diagnosis present

## 2023-11-13 DIAGNOSIS — K611 Rectal abscess: Secondary | ICD-10-CM | POA: Diagnosis present

## 2023-11-13 DIAGNOSIS — K610 Anal abscess: Secondary | ICD-10-CM | POA: Diagnosis not present

## 2023-11-13 DIAGNOSIS — Z87891 Personal history of nicotine dependence: Secondary | ICD-10-CM | POA: Diagnosis not present

## 2023-11-13 LAB — BASIC METABOLIC PANEL WITH GFR
Anion gap: 10 (ref 5–15)
BUN: 9 mg/dL (ref 6–20)
CO2: 24 mmol/L (ref 22–32)
Calcium: 9.5 mg/dL (ref 8.9–10.3)
Chloride: 106 mmol/L (ref 98–111)
Creatinine, Ser: 0.9 mg/dL (ref 0.61–1.24)
GFR, Estimated: >60 mL/min (ref >60)
Glucose, Bld: 100 mg/dL — ABNORMAL HIGH (ref 70–99)
Potassium: 4.3 mmol/L (ref 3.5–5.1)
Sodium: 140 mmol/L (ref 135–145)

## 2023-11-13 LAB — CBC
HCT: 43.1 % (ref 39.0–52.0)
Hemoglobin: 14.3 g/dL (ref 13.0–17.0)
MCH: 30.1 pg (ref 26.0–34.0)
MCHC: 33.2 g/dL (ref 30.0–36.0)
MCV: 90.7 fL (ref 80.0–100.0)
Platelets: 202 K/uL (ref 150–400)
RBC: 4.75 MIL/uL (ref 4.22–5.81)
RDW: 11.1 % — ABNORMAL LOW (ref 11.5–15.5)
WBC: 8.5 K/uL (ref 4.0–10.5)
nRBC: 0 % (ref 0.0–0.2)

## 2023-11-13 MED ORDER — SODIUM CHLORIDE 0.9 % IV SOLN
1.0000 g | Freq: Once | INTRAVENOUS | Status: AC
  Administered 2023-11-13: 1 g via INTRAVENOUS
  Filled 2023-11-13: qty 10

## 2023-11-13 MED ORDER — CEFAZOLIN SODIUM-DEXTROSE 2-4 GM/100ML-% IV SOLN
2.0000 g | INTRAVENOUS | Status: AC
  Administered 2023-11-14: 2 g via INTRAVENOUS
  Filled 2023-11-13: qty 100

## 2023-11-13 MED ORDER — METRONIDAZOLE 500 MG/100ML IV SOLN
500.0000 mg | Freq: Once | INTRAVENOUS | Status: AC
  Administered 2023-11-13: 500 mg via INTRAVENOUS
  Filled 2023-11-13: qty 100

## 2023-11-13 MED ORDER — HYDROMORPHONE HCL 1 MG/ML IJ SOLN: 1.0000 mg | INTRAMUSCULAR | Status: DC | PRN

## 2023-11-13 MED ORDER — ONDANSETRON HCL 4 MG/2ML IJ SOLN: 4.0000 mg | Freq: Four times a day (QID) | INTRAMUSCULAR | Status: DC | PRN

## 2023-11-13 MED ORDER — IBUPROFEN 600 MG PO TABS: 600.0000 mg | ORAL_TABLET | Freq: Four times a day (QID) | ORAL | Status: DC | PRN

## 2023-11-13 MED ORDER — HYDROMORPHONE HCL 1 MG/ML IJ SOLN
1.0000 mg | INTRAMUSCULAR | Status: DC | PRN
  Administered 2023-11-14: 1 mg via INTRAVENOUS
  Filled 2023-11-13: qty 1

## 2023-11-13 MED ORDER — DEXTROSE-SODIUM CHLORIDE 5-0.9 % IV SOLN: INTRAVENOUS | Status: AC

## 2023-11-13 MED ORDER — CHLORHEXIDINE GLUCONATE CLOTH 2 % EX PADS: 6.0000 | MEDICATED_PAD | Freq: Once | CUTANEOUS | Status: AC

## 2023-11-13 MED ORDER — IBUPROFEN 800 MG PO TABS
800.0000 mg | ORAL_TABLET | Freq: Once | ORAL | Status: AC
  Administered 2023-11-13: 800 mg via ORAL
  Filled 2023-11-13: qty 1

## 2023-11-13 MED ORDER — ENOXAPARIN SODIUM 40 MG/0.4ML IJ SOSY
40.0000 mg | PREFILLED_SYRINGE | INTRAMUSCULAR | Status: DC
  Administered 2023-11-13: 40 mg via SUBCUTANEOUS
  Filled 2023-11-13: qty 0.4

## 2023-11-13 MED ORDER — IOHEXOL 300 MG/ML  SOLN
85.0000 mL | Freq: Once | INTRAMUSCULAR | Status: AC | PRN
  Administered 2023-11-13: 85 mL via INTRAVENOUS

## 2023-11-13 MED ORDER — ONDANSETRON 4 MG PO TBDP: 4.0000 mg | ORAL_TABLET | Freq: Four times a day (QID) | ORAL | Status: DC | PRN

## 2023-11-13 MED ORDER — CHLORHEXIDINE GLUCONATE CLOTH 2 % EX PADS
6.0000 | MEDICATED_PAD | Freq: Once | CUTANEOUS | Status: AC
  Administered 2023-11-13: 6 via TOPICAL

## 2023-11-13 MED ORDER — METRONIDAZOLE 500 MG/100ML IV SOLN
500.0000 mg | Freq: Two times a day (BID) | INTRAVENOUS | Status: DC
  Administered 2023-11-14: 500 mg via INTRAVENOUS
  Filled 2023-11-13: qty 100

## 2023-11-13 MED ORDER — ACETAMINOPHEN 500 MG PO TABS
1000.0000 mg | ORAL_TABLET | Freq: Four times a day (QID) | ORAL | Status: DC
  Administered 2023-11-14: 1000 mg via ORAL
  Filled 2023-11-13 (×2): qty 2

## 2023-11-13 MED ORDER — OXYCODONE HCL 5 MG PO TABS
5.0000 mg | ORAL_TABLET | ORAL | Status: DC | PRN
  Administered 2023-11-13 – 2023-11-14 (×3): 5 mg via ORAL
  Filled 2023-11-13 (×3): qty 1

## 2023-11-13 MED ORDER — SODIUM CHLORIDE 0.9 % IV SOLN: 2.0000 g | INTRAVENOUS | Status: DC

## 2023-11-13 NOTE — ED Provider Notes (Addendum)
 [facility] Provider Note   CSN: [id number] Arrival date & time: 11/13/23  1019     Patient presents with: Abscess   Shawn Dunlap is a 33 y.o. male.  33 year old male presents ED with complaints of abscess in the perineum.  Patient reports he went to urgent care on Tuesday and had it drained and was placed on Augmentin .  He reports he woke up this morning and felt like it was draining a lot in the bed took a bath and decided to come in for reassessment.  He reports significant pain but has been taking 800 mg ibuprofen  prescribed by urgent care with relief.  Patient denies any fevers, shortness of breath, chest pain, nausea, vomiting, diarrhea or any urinary symptoms.    Prior to Admission medications   Medication Sig Start Date End Date Taking? Authorizing Provider  amoxicillin -clavulanate (AUGMENTIN ) 875-125 MG tablet Take 1 tablet by mouth 2 (two) times daily for 7 days. 11/11/23 11/18/23 Yes Vonna Sharlet POUR, MD  ibuprofen  (ADVIL ) 800 MG tablet Take 1 tablet (800 mg total) by mouth every 8 (eight) hours as needed (pain). 11/11/23  Yes Vonna Sharlet POUR, MD    Allergies: Patient has no known allergies.    Review of Systems  Skin:  Positive for color change.  All other systems reviewed and are negative.   Updated Vital Signs BP 112/73 (BP Location: Left Arm)   Pulse 73   Temp 97.9 F (36.6 C) (Oral)   Resp 16   SpO2 100%   Physical Exam Vitals and nursing note reviewed.  Constitutional:      General: He is not in acute distress.    Appearance: Normal appearance. He is not ill-appearing.  HENT:     Head: Normocephalic and atraumatic.  Eyes:     Extraocular Movements: Extraocular movements intact.     Pupils: Pupils are equal, round, and reactive to light.  Cardiovascular:     Rate and Rhythm: Normal rate.  Pulmonary:     Effort: Pulmonary effort is normal. No respiratory distress.  Abdominal:     Tenderness: There is  no guarding.  Musculoskeletal:        General: Normal range of motion.     Cervical back: Normal range of motion.  Skin:    General: Skin is warm and dry.     Capillary Refill: Capillary refill takes less than 2 seconds.     Comments: Abscess in the perineum at the base of the left scrotum.  Neurological:     General: No focal deficit present.     Mental Status: He is alert.  Psychiatric:        Mood and Affect: Mood normal.        Behavior: Behavior normal.     (all labs ordered are listed, but only abnormal results are displayed) Labs Reviewed  BASIC METABOLIC PANEL WITH GFR - Abnormal; Notable for the following components:      Result Value   Glucose, Bld 100 (*)    All other components within normal limits  CBC - Abnormal; Notable for the following components:   RDW 11.1 (*)    All other components within normal limits    EKG: None  Radiology: CT PELVIS W CONTRAST Result Date: 11/13/2023 CLINICAL DATA:  Perianal abscess or fistula suspected attempted drainage EXAM: CT PELVIS WITH CONTRAST TECHNIQUE: Multidetector CT imaging of the pelvis was performed using the standard protocol following the bolus administration of intravenous  contrast. RADIATION DOSE REDUCTION: This exam was performed according to the departmental dose-optimization program which includes automated exposure control, adjustment of the mA and/or kV according to patient size and/or use of iterative reconstruction technique. CONTRAST:  85mL OMNIPAQUE  IOHEXOL  300 MG/ML  SOLN COMPARISON:  None Available. FINDINGS: Urinary Tract:  No abnormality visualized. Bowel:  Unremarkable visualized pelvic bowel loops. Vascular/Lymphatic: No pathologically enlarged lymph nodes. No significant vascular abnormality seen. Reproductive:  No mass or other significant abnormality Other: Elongated rim enhancing subcutaneous fluid collection at the left aspect of the gluteal cleft and perineum, anterior to the anus, measuring 4.9 x 2.0 x  4.1 cm (series 2, image 64, series 4, image 42). There is possibly a fistula tract extending to the sphincter complex although this is not well evaluated by CT. Musculoskeletal: No suspicious bone lesions identified. IMPRESSION: Elongated rim enhancing subcutaneous fluid collection at the left aspect of the gluteal cleft and perineum, anterior to the anus, measuring 4.9 x 2.0 x 4.1 cm. There is possibly a fistula tract extending to the sphincter complex although this is not well evaluated by CT. 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 Recommend multiphasic contrast enhanced fistula protocol pelvic MRI for further assessment. Electronically Signed   By: Marolyn JONETTA Jaksch M.D.   On: 11/13/2023 12:45     Procedures   Medications Ordered in the ED  metroNIDAZOLE  (FLAGYL ) IVPB 500 mg (500 mg Intravenous New Bag/Given 11/13/23 1546)  ibuprofen  (ADVIL ) tablet 800 mg (has no administration in time range)  iohexol  (OMNIPAQUE ) 300 MG/ML solution 85 mL (85 mLs Intravenous Contrast Given 11/13/23 1219)  cefTRIAXone  (ROCEPHIN ) 1 g in sodium chloride  0.9 % 100 mL IVPB (1 g Intravenous New Bag/Given 11/13/23 1512)    33 y.o. male presents to the ED with complaints of abscess, this involves an extensive number of treatment options, and is a complaint that carries with it a high risk of complications and morbidity.  The differential diagnosis includes abscess, cyst,  (Ddx)  On arrival pt is nontoxic, vitals unremarkable. Exam significant for abscess in the perineum near the left base of the scrotum.  I ordered medication Rocephin , Flagyl , Advil  for abscess and pain.  Lab Tests:  I Ordered, reviewed, and interpreted labs, which included: BMP, CBC  Imaging Studies ordered:  I ordered imaging studies which included CT pelvis with contrast, I independently visualized and interpreted imaging which showed extensive abscess in the perianal area with potential fistula.  ED Course:   33 year old  male presents ED with complaints of abscess in his perineum.  Patient reports on Tuesday he had it drained and was placed on Augmentin .  Patient reports this got worse and this morning he had significant pain and felt drainage while he was getting out of bed.  Took a bath and decided come in for evaluation.  Patient denies any fevers, shortness of breath, chest pain, nausea, vomiting, diarrhea, urinary symptoms.  Patient is nontoxic-appearing on exam and in no acute distress.  Patient has approximately 8 cm visible abscess in the perineum at the left base of the scrotum.  Pain to palpation to the abscess with mild pain to palpation to the base of the scrotum.  No pain to palpation on base of the buttocks.  Patient is afebrile and has no increased respiratory rate vitals are all unremarkable.  Pain has been managed well with 800 mg ibuprofen  prescribed by urgent care.  CT pelvis will be ordered to evaluate depth and severity of abscess.  CT  remarkable for a large abscess in the gluteal cleft and perineum with fistula.  MRI was recommended for further evaluation.  General surgery was consulted with recommendations of starting on IV antibiotics and admitting for further imaging and management.  I spoke with Marjorie Favre PA-C with surgery and her attending Dr. Vanderbilt will be admitting for further management of abscess.  Patient was started on antibiotics and ibuprofen  for pain.  Patient was advised of findings and treatment plan and was comfortable with treatment.   Portions of this note were generated with Scientist, clinical (histocompatibility and immunogenetics). Dictation errors may occur despite best attempts at proofreading.   Final diagnoses:  Abscess, perineum    ED Discharge Orders     None          Shawn Dunlap 11/13/23 1547    Shawn Dunlap 11/13/23 1549    Doretha Folks, MD 11/21/23 [phone]

## 2023-11-13 NOTE — ED Notes (Signed)
Shawn Dunlap with cl called for transport 

## 2023-11-13 NOTE — ED Triage Notes (Signed)
 Patient states abscess to perineum. Seen at urgent care and drainage was attempted but only blood came out. Taking Augmentin  since. Pain has worsened.

## 2023-11-13 NOTE — Consult Note (Addendum)
 Consult Note  Shawn Dunlap [birth year]  [id number].    Requesting MD:  Dr. 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   Patient had gone to urgent care on 9/23 due to pain and fullness in perineum and underwent an I&D. He was provided with course of Augmentin  at that time. Initially patient states there was no purulent drainage.  Earlier this morning, the patient reported that there was an increase in drainage that became purulent and felt that the area needed to be reassessed. Having significant pain that has been managed with 800 mg ibuprofen  prescribed by urgent care. He denied fevers, SOB, chest pain, n/v, diarrhea, or urinary symptoms.  Work up in the ED showed concerns for abscess of the left aspect of the gluteal cleft and perineum with possible anal fistula. BMP normal with the exception of glucose (100). No leukocytosis. Normal HGB. Patient transferred to [facility]. General surgery has been asked to consult.  Surgical history: Cardiac procedure at age 35 Daily prescription medications: Denies anticoagulation meds.    ROS: Per HPI  Family History  Problem Relation Age of Onset   Healthy Mother     No past medical history on file.  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    Social History:  reports that he has quit smoking. His smoking use included cigarettes. He has never used smokeless tobacco. He reports that he does not currently use alcohol. He reports that he does not use drugs.  Allergies: No Known Allergies  Medications Prior to Admission  Medication Sig Dispense Refill   amoxicillin -clavulanate (AUGMENTIN ) 875-125 MG tablet Take 1 tablet by mouth 2 (two)  times daily for 7 days. 14 tablet 0   ibuprofen  (ADVIL ) 800 MG tablet Take 1 tablet (800 mg total) by mouth every 8 (eight) hours as needed (pain). 21 tablet 0    Blood pressure 134/82, pulse 65, temperature 98 F (36.7 C), temperature source Oral, resp. rate 17, SpO2 98%. Physical Exam:  General: pleasant male in mild distress secondary to pain HEENT: head is normocephalic, atraumatic.  Sclera are noninjected.  PERRL.  Ears and nose without any masses or lesions.  Mouth is pink and moist Heart: regular, rate, and rhythm.  Lungs: Normal work of breathing on room air Abd: soft, NT, ND GU: Induration, fluctuance and drainage to left perineum and base of left scrotum, exam limited secondary to pain MS: all 4 extremities are symmetrical with no cyanosis, clubbing, or edema. Skin: warm and dry with no masses, lesions, or rashes Neuro: No focal neurologic deficits Psych: A&Ox3 with an appropriate affect.   Results for orders placed or performed during the hospital encounter of 11/13/23 (from the past 48 hours)  Basic metabolic panel     Status: Abnormal   Collection Time: 11/13/23 11:26 AM  Result Value Ref Range   Sodium 140 135 - 145 mmol/L   Potassium 4.3 3.5 - 5.1 mmol/L   Chloride 106 98 - 111 mmol/L   CO2 24 22 - 32 mmol/L   Glucose, Bld 100 (H) 70 - 99 mg/dL    Comment: Glucose reference range applies only to samples taken after fasting for  at least 8 hours.   BUN 9 6 - 20 mg/dL   Creatinine, Ser 9.09 0.61 - 1.24 mg/dL   Calcium 9.5 8.9 - 89.6 mg/dL   GFR, Estimated >39 >39 mL/min    Comment: (NOTE) Calculated using the CKD-EPI Creatinine Equation (2021)    Anion gap 10 5 - 15    Comment: Performed at [facility], [address] [place], [place] [address]  CBC     Status: Abnormal   Collection Time: 11/13/23 11:26 AM  Result Value Ref Range   WBC 8.5 4.0 - 10.5 K/uL   RBC 4.75 4.22 - 5.81 MIL/uL   Hemoglobin 14.3 13.0 - 17.0 g/dL   HCT 56.8 60.9 -  47.9 %   MCV 90.7 80.0 - 100.0 fL   MCH 30.1 26.0 - 34.0 pg   MCHC 33.2 30.0 - 36.0 g/dL   RDW 88.8 (L) 88.4 - 84.4 %   Platelets 202 150 - 400 K/uL   nRBC 0.0 0.0 - 0.2 %    Comment: Performed at [facility], [address], [place], [place] [address]   CT PELVIS W CONTRAST Result Date: 11/13/2023 CLINICAL DATA:  Perianal abscess or fistula suspected attempted drainage EXAM: CT PELVIS WITH CONTRAST TECHNIQUE: Multidetector CT imaging of the pelvis was performed using the standard protocol following the bolus administration of intravenous contrast. RADIATION DOSE REDUCTION: This exam was performed according to the departmental dose-optimization program which includes automated exposure control, adjustment of the mA and/or kV according to patient size and/or use of iterative reconstruction technique. CONTRAST:  85mL OMNIPAQUE  IOHEXOL  300 MG/ML  SOLN COMPARISON:  None Available. FINDINGS: Urinary Tract:  No abnormality visualized. Bowel:  Unremarkable visualized pelvic bowel loops. Vascular/Lymphatic: No pathologically enlarged lymph nodes. No significant vascular abnormality seen. Reproductive:  No mass or other significant abnormality Other: Elongated rim enhancing subcutaneous fluid collection at the left aspect of the gluteal cleft and perineum, anterior to the anus, measuring 4.9 x 2.0 x 4.1 cm (series 2, image 64, series 4, image 42). There is possibly a fistula tract extending to the sphincter complex although this is not well evaluated by CT. Musculoskeletal: No suspicious bone lesions identified. IMPRESSION: Elongated rim enhancing subcutaneous fluid collection at the left aspect of the gluteal cleft and perineum, anterior to the anus, measuring 4.9 x 2.0 x 4.1 cm. There is possibly a fistula tract extending to the sphincter complex although this is not well evaluated by CT. 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 Recommend multiphasic contrast enhanced  fistula protocol pelvic MRI for further assessment. Electronically Signed   By: Marolyn JONETTA Jaksch M.D.   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 There is possibly a fistula tract extending to the sphincter complex although this is not well evaluated by CT. 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 - Afebrile. WBC WNL - No significant electrolyte abnormalities. - IV antibiotics, already received ceftriaxone /flagyl  in ED, will continue these - Reviewed labs, imaging, and history that is concerning for abscess. Recommend treatment of I&D under anesthesia. Discussed pre and post operative expectations along with potential risks in great detail. Will plan for this 9/26. - Okay to have regular diet. Will make NPO  at midnight.   FEN: Regular. NPO at midnight. VTE: SCDs   I reviewed ED provider notes, last 24 h vitals and pain scores, last 48 h intake and output, last 24 h labs and trends, and last 24 h imaging results.  This care required moderate level of medical decision making.   Orie Silversmith, MD [facility]

## 2023-11-13 NOTE — H&P (View-Only) (Signed)
 Consult Note  Shawn Dunlap [birth year]  [id number].    Requesting MD:  Dr. 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   Patient had gone to urgent care on 9/23 due to pain and fullness in perineum and underwent an I&D. He was provided with course of Augmentin  at that time. Initially patient states there was no purulent drainage.  Earlier this morning, the patient reported that there was an increase in drainage that became purulent and felt that the area needed to be reassessed. Having significant pain that has been managed with 800 mg ibuprofen  prescribed by urgent care. He denied fevers, SOB, chest pain, n/v, diarrhea, or urinary symptoms.  Work up in the ED showed concerns for abscess of the left aspect of the gluteal cleft and perineum with possible anal fistula. BMP normal with the exception of glucose (100). No leukocytosis. Normal HGB. Patient transferred to [facility]. General surgery has been asked to consult.  Surgical history: Cardiac procedure at age 35 Daily prescription medications: Denies anticoagulation meds.    ROS: Per HPI  Family History  Problem Relation Age of Onset   Healthy Mother     No past medical history on file.  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    Social History:  reports that he has quit smoking. His smoking use included cigarettes. He has never used smokeless tobacco. He reports that he does not currently use alcohol. He reports that he does not use drugs.  Allergies: No Known Allergies  Medications Prior to Admission  Medication Sig Dispense Refill   amoxicillin -clavulanate (AUGMENTIN ) 875-125 MG tablet Take 1 tablet by mouth 2 (two)  times daily for 7 days. 14 tablet 0   ibuprofen  (ADVIL ) 800 MG tablet Take 1 tablet (800 mg total) by mouth every 8 (eight) hours as needed (pain). 21 tablet 0    Blood pressure 134/82, pulse 65, temperature 98 F (36.7 C), temperature source Oral, resp. rate 17, SpO2 98%. Physical Exam:  General: pleasant male in mild distress secondary to pain HEENT: head is normocephalic, atraumatic.  Sclera are noninjected.  PERRL.  Ears and nose without any masses or lesions.  Mouth is pink and moist Heart: regular, rate, and rhythm.  Lungs: Normal work of breathing on room air Abd: soft, NT, ND GU: Induration, fluctuance and drainage to left perineum and base of left scrotum, exam limited secondary to pain MS: all 4 extremities are symmetrical with no cyanosis, clubbing, or edema. Skin: warm and dry with no masses, lesions, or rashes Neuro: No focal neurologic deficits Psych: A&Ox3 with an appropriate affect.   Results for orders placed or performed during the hospital encounter of 11/13/23 (from the past 48 hours)  Basic metabolic panel     Status: Abnormal   Collection Time: 11/13/23 11:26 AM  Result Value Ref Range   Sodium 140 135 - 145 mmol/L   Potassium 4.3 3.5 - 5.1 mmol/L   Chloride 106 98 - 111 mmol/L   CO2 24 22 - 32 mmol/L   Glucose, Bld 100 (H) 70 - 99 mg/dL    Comment: Glucose reference range applies only to samples taken after fasting for  at least 8 hours.   BUN 9 6 - 20 mg/dL   Creatinine, Ser 9.09 0.61 - 1.24 mg/dL   Calcium 9.5 8.9 - 89.6 mg/dL   GFR, Estimated >39 >39 mL/min    Comment: (NOTE) Calculated using the CKD-EPI Creatinine Equation (2021)    Anion gap 10 5 - 15    Comment: Performed at [facility], [address] [place], [place] [address]  CBC     Status: Abnormal   Collection Time: 11/13/23 11:26 AM  Result Value Ref Range   WBC 8.5 4.0 - 10.5 K/uL   RBC 4.75 4.22 - 5.81 MIL/uL   Hemoglobin 14.3 13.0 - 17.0 g/dL   HCT 56.8 60.9 -  47.9 %   MCV 90.7 80.0 - 100.0 fL   MCH 30.1 26.0 - 34.0 pg   MCHC 33.2 30.0 - 36.0 g/dL   RDW 88.8 (L) 88.4 - 84.4 %   Platelets 202 150 - 400 K/uL   nRBC 0.0 0.0 - 0.2 %    Comment: Performed at [facility], [address], [place], [place] [address]   CT PELVIS W CONTRAST Result Date: 11/13/2023 CLINICAL DATA:  Perianal abscess or fistula suspected attempted drainage EXAM: CT PELVIS WITH CONTRAST TECHNIQUE: Multidetector CT imaging of the pelvis was performed using the standard protocol following the bolus administration of intravenous contrast. RADIATION DOSE REDUCTION: This exam was performed according to the departmental dose-optimization program which includes automated exposure control, adjustment of the mA and/or kV according to patient size and/or use of iterative reconstruction technique. CONTRAST:  85mL OMNIPAQUE  IOHEXOL  300 MG/ML  SOLN COMPARISON:  None Available. FINDINGS: Urinary Tract:  No abnormality visualized. Bowel:  Unremarkable visualized pelvic bowel loops. Vascular/Lymphatic: No pathologically enlarged lymph nodes. No significant vascular abnormality seen. Reproductive:  No mass or other significant abnormality Other: Elongated rim enhancing subcutaneous fluid collection at the left aspect of the gluteal cleft and perineum, anterior to the anus, measuring 4.9 x 2.0 x 4.1 cm (series 2, image 64, series 4, image 42). There is possibly a fistula tract extending to the sphincter complex although this is not well evaluated by CT. Musculoskeletal: No suspicious bone lesions identified. IMPRESSION: Elongated rim enhancing subcutaneous fluid collection at the left aspect of the gluteal cleft and perineum, anterior to the anus, measuring 4.9 x 2.0 x 4.1 cm. There is possibly a fistula tract extending to the sphincter complex although this is not well evaluated by CT. 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 Recommend multiphasic contrast enhanced  fistula protocol pelvic MRI for further assessment. Electronically Signed   By: Marolyn JONETTA Jaksch M.D.   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 There is possibly a fistula tract extending to the sphincter complex although this is not well evaluated by CT. 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 - Afebrile. WBC WNL - No significant electrolyte abnormalities. - IV antibiotics, already received ceftriaxone /flagyl  in ED, will continue these - Reviewed labs, imaging, and history that is concerning for abscess. Recommend treatment of I&D under anesthesia. Discussed pre and post operative expectations along with potential risks in great detail. Will plan for this 9/26. - Okay to have regular diet. Will make NPO  at midnight.   FEN: Regular. NPO at midnight. VTE: SCDs   I reviewed ED provider notes, last 24 h vitals and pain scores, last 48 h intake and output, last 24 h labs and trends, and last 24 h imaging results.  This care required moderate level of medical decision making.   Orie Silversmith, MD [facility]

## 2023-11-14 ENCOUNTER — Other Ambulatory Visit: Payer: Self-pay

## 2023-11-14 ENCOUNTER — Other Ambulatory Visit (HOSPITAL_COMMUNITY): Payer: Self-pay

## 2023-11-14 ENCOUNTER — Inpatient Hospital Stay (HOSPITAL_COMMUNITY): Payer: Self-pay | Admitting: Certified Registered Nurse Anesthetist

## 2023-11-14 ENCOUNTER — Encounter (HOSPITAL_COMMUNITY)
Admission: EM | Disposition: A | Discharge status: Home / Self Care | Payer: Self-pay | Source: Home / Self Care | Attending: Surgery

## 2023-11-14 ENCOUNTER — Encounter (HOSPITAL_COMMUNITY): Payer: Self-pay

## 2023-11-14 DIAGNOSIS — K610 Anal abscess: Secondary | ICD-10-CM

## 2023-11-14 HISTORY — PX: INCISION AND DRAINAGE PERIRECTAL ABSCESS: SHX1804

## 2023-11-14 LAB — SURGICAL PCR SCREEN
MRSA, PCR: NEGATIVE
Staphylococcus aureus: NEGATIVE

## 2023-11-14 LAB — HIV ANTIBODY (ROUTINE TESTING W REFLEX): HIV Screen 4th Generation wRfx: NONREACTIVE

## 2023-11-14 LAB — GLUCOSE, CAPILLARY: Glucose-Capillary: 88 mg/dL (ref 70–99)

## 2023-11-14 SURGERY — INCISION AND DRAINAGE, ABSCESS, PERIRECTAL
Anesthesia: General | Laterality: Left

## 2023-11-14 MED ORDER — 0.9 % SODIUM CHLORIDE (POUR BTL) OPTIME
TOPICAL | Status: DC | PRN
  Administered 2023-11-14: 1000 mL

## 2023-11-14 MED ORDER — ACETAMINOPHEN 500 MG PO TABS: 500.0000 mg | ORAL_TABLET | Freq: Four times a day (QID) | ORAL | Status: AC | PRN

## 2023-11-14 MED ORDER — ONDANSETRON HCL 4 MG/2ML IJ SOLN: 4.0000 mg | Freq: Once | INTRAMUSCULAR | Status: DC | PRN

## 2023-11-14 MED ORDER — OXYCODONE HCL 5 MG PO TABS: 5.0000 mg | ORAL_TABLET | Freq: Once | ORAL | Status: DC | PRN

## 2023-11-14 MED ORDER — DROPERIDOL 2.5 MG/ML IJ SOLN: 0.6250 mg | Freq: Once | INTRAMUSCULAR | Status: DC | PRN

## 2023-11-14 MED ORDER — ROCURONIUM BROMIDE 10 MG/ML (PF) SYRINGE
PREFILLED_SYRINGE | INTRAVENOUS | Status: DC | PRN
  Administered 2023-11-14: 60 mg via INTRAVENOUS

## 2023-11-14 MED ORDER — OXYCODONE HCL 5 MG PO TABS
5.0000 mg | ORAL_TABLET | Freq: Four times a day (QID) | ORAL | 0 refills | Status: AC | PRN
  Filled 2023-11-14: qty 20, 5d supply, fill #0

## 2023-11-14 MED ORDER — FENTANYL CITRATE (PF) 250 MCG/5ML IJ SOLN
INTRAMUSCULAR | Status: AC
  Filled 2023-11-14: qty 5

## 2023-11-14 MED ORDER — MIDAZOLAM HCL 2 MG/2ML IJ SOLN
INTRAMUSCULAR | Status: DC | PRN
  Administered 2023-11-14: 2 mg via INTRAVENOUS

## 2023-11-14 MED ORDER — HYDROMORPHONE HCL 1 MG/ML IJ SOLN: 0.2500 mg | INTRAMUSCULAR | Status: DC | PRN

## 2023-11-14 MED ORDER — ROCURONIUM BROMIDE 10 MG/ML (PF) SYRINGE
PREFILLED_SYRINGE | INTRAVENOUS | Status: AC
  Filled 2023-11-14: qty 10

## 2023-11-14 MED ORDER — CHLORHEXIDINE GLUCONATE 0.12 % MT SOLN
OROMUCOSAL | Status: AC
  Administered 2023-11-14: 15 mL via OROMUCOSAL
  Filled 2023-11-14: qty 15

## 2023-11-14 MED ORDER — ONDANSETRON HCL 4 MG/2ML IJ SOLN
INTRAMUSCULAR | Status: AC
  Filled 2023-11-14: qty 2

## 2023-11-14 MED ORDER — OXYCODONE HCL 5 MG/5ML PO SOLN: 5.0000 mg | Freq: Once | ORAL | Status: DC | PRN

## 2023-11-14 MED ORDER — BUPIVACAINE-EPINEPHRINE (PF) 0.25% -1:200000 IJ SOLN
INTRAMUSCULAR | Status: AC
  Filled 2023-11-14: qty 30

## 2023-11-14 MED ORDER — DEXAMETHASONE SODIUM PHOSPHATE 10 MG/ML IJ SOLN
INTRAMUSCULAR | Status: AC
  Filled 2023-11-14: qty 1

## 2023-11-14 MED ORDER — SUGAMMADEX SODIUM 200 MG/2ML IV SOLN
INTRAVENOUS | Status: DC | PRN
  Administered 2023-11-14: 200 mg via INTRAVENOUS

## 2023-11-14 MED ORDER — ONDANSETRON HCL 4 MG/2ML IJ SOLN
INTRAMUSCULAR | Status: DC | PRN
  Administered 2023-11-14: 4 mg via INTRAVENOUS

## 2023-11-14 MED ORDER — LIDOCAINE 2% (20 MG/ML) 5 ML SYRINGE
INTRAMUSCULAR | Status: DC | PRN
  Administered 2023-11-14: 80 mg via INTRAVENOUS

## 2023-11-14 MED ORDER — MIDAZOLAM HCL 2 MG/2ML IJ SOLN
INTRAMUSCULAR | Status: AC
  Filled 2023-11-14: qty 2

## 2023-11-14 MED ORDER — PROPOFOL 10 MG/ML IV BOLUS
INTRAVENOUS | Status: AC
  Filled 2023-11-14: qty 20

## 2023-11-14 MED ORDER — ORAL CARE MOUTH RINSE: 15.0000 mL | Freq: Once | OROMUCOSAL | Status: AC

## 2023-11-14 MED ORDER — CHLORHEXIDINE GLUCONATE 0.12 % MT SOLN: 15.0000 mL | Freq: Once | OROMUCOSAL | Status: AC

## 2023-11-14 MED ORDER — BUPIVACAINE-EPINEPHRINE 0.25% -1:200000 IJ SOLN
INTRAMUSCULAR | Status: DC | PRN
  Administered 2023-11-14: 10 mL

## 2023-11-14 MED ORDER — FENTANYL CITRATE (PF) 250 MCG/5ML IJ SOLN
INTRAMUSCULAR | Status: DC | PRN
  Administered 2023-11-14: 100 ug via INTRAVENOUS

## 2023-11-14 MED ORDER — LACTATED RINGERS IV SOLN: INTRAVENOUS | Status: DC

## 2023-11-14 MED ORDER — PROPOFOL 10 MG/ML IV BOLUS
INTRAVENOUS | Status: DC | PRN
  Administered 2023-11-14: 180 mg via INTRAVENOUS

## 2023-11-14 MED ORDER — DEXAMETHASONE SODIUM PHOSPHATE 10 MG/ML IJ SOLN
INTRAMUSCULAR | Status: DC | PRN
  Administered 2023-11-14: 10 mg via INTRAVENOUS

## 2023-11-14 MED ORDER — LIDOCAINE 2% (20 MG/ML) 5 ML SYRINGE
INTRAMUSCULAR | Status: AC
  Filled 2023-11-14: qty 5

## 2023-11-14 SURGICAL SUPPLY — 31 items
BAG COUNTER SPONGE SURGICOUNT (BAG) ×2 IMPLANT
BRIEF MESH DISP LRG (UNDERPADS AND DIAPERS) ×2 IMPLANT
CANISTER SUCTION 3000ML PPV (SUCTIONS) ×2 IMPLANT
COVER SURGICAL LIGHT HANDLE (MISCELLANEOUS) ×2 IMPLANT
ELECTRODE REM PT RTRN 9FT ADLT (ELECTROSURGICAL) ×2 IMPLANT
GAUZE PACKING IODOFORM 1/2INX (GAUZE/BANDAGES/DRESSINGS) IMPLANT
GAUZE PAD ABD 8X10 STRL (GAUZE/BANDAGES/DRESSINGS) ×2 IMPLANT
GAUZE SPONGE 4X4 12PLY STRL (GAUZE/BANDAGES/DRESSINGS) ×2 IMPLANT
GLOVE BIO SURGEON STRL SZ8 (GLOVE) ×2 IMPLANT
GLOVE BIOGEL PI IND STRL 8 (GLOVE) ×2 IMPLANT
GOWN STRL REUS W/ TWL LRG LVL3 (GOWN DISPOSABLE) ×2 IMPLANT
GOWN STRL REUS W/ TWL XL LVL3 (GOWN DISPOSABLE) ×2 IMPLANT
KIT BASIN OR (CUSTOM PROCEDURE TRAY) ×2 IMPLANT
KIT TURNOVER KIT B (KITS) ×2 IMPLANT
NDL HYPO 25GX1X1/2 BEV (NEEDLE) IMPLANT
NEEDLE HYPO 25GX1X1/2 BEV (NEEDLE) IMPLANT
PACK LITHOTOMY IV (CUSTOM PROCEDURE TRAY) ×2 IMPLANT
PAD ARMBOARD POSITIONER FOAM (MISCELLANEOUS) ×2 IMPLANT
PENCIL SMOKE EVACUATOR (MISCELLANEOUS) ×2 IMPLANT
SOLN 0.9% NACL 1000 ML (IV SOLUTION) ×1 IMPLANT
SOLN 0.9% NACL POUR BTL 1000ML (IV SOLUTION) ×2 IMPLANT
SPONGE T-LAP 18X18 ~~LOC~~+RFID (SPONGE) ×2 IMPLANT
SWAB COLLECTION DEVICE MRSA (MISCELLANEOUS) IMPLANT
SWAB CULTURE ESWAB REG 1ML (MISCELLANEOUS) IMPLANT
SYR BULB IRRIG 60ML STRL (SYRINGE) ×2 IMPLANT
SYR CONTROL 10ML LL (SYRINGE) IMPLANT
TOWEL GREEN STERILE (TOWEL DISPOSABLE) ×2 IMPLANT
TOWEL GREEN STERILE FF (TOWEL DISPOSABLE) ×2 IMPLANT
TUBE CONNECTING 12X1/4 (SUCTIONS) ×2 IMPLANT
UNDERPAD 30X36 HEAVY ABSORB (UNDERPADS AND DIAPERS) ×2 IMPLANT
YANKAUER SUCT BULB TIP NO VENT (SUCTIONS) ×2 IMPLANT

## 2023-11-14 NOTE — Discharge Instructions (Signed)
 Remove packing tomorrow. No need to repack. Keep clean and cover with a dry dressing. Okay to shower tomorrow. Do not submerge wound in bodies of water.

## 2023-11-14 NOTE — Anesthesia Procedure Notes (Signed)
 Procedure Name: Intubation Date/Time: 11/14/2023 9:19 AM  Performed by: Claudene Florina Boga, CRNAPre-anesthesia Checklist: Patient identified, Emergency Drugs available, Suction available and Patient being monitored Patient Re-evaluated:Patient Re-evaluated prior to induction Oxygen Delivery Method: Circle System Utilized Preoxygenation: Pre-oxygenation with 100% oxygen Induction Type: IV induction Ventilation: Mask ventilation without difficulty Laryngoscope Size: Mac and 4 Grade View: Grade I Tube type: Oral Tube size: 7.5 mm Number of attempts: 1 Airway Equipment and Method: Stylet Placement Confirmation: ETT inserted through vocal cords under direct vision, positive ETCO2 and breath sounds checked- equal and bilateral Secured at: 23 cm Tube secured with: Tape Dental Injury: Teeth and Oropharynx as per pre-operative assessment

## 2023-11-14 NOTE — Interval H&P Note (Signed)
 History and Physical Interval Note:  11/14/2023 8:28 AM  Shawn Dunlap  has presented today for surgery, with the diagnosis of LEFT GLUTEAL CLEFT/PERINEAL ABSCESS.  The various methods of treatment have been discussed with the patient and family. After consideration of risks, benefits and other options for treatment, the patient has consented to  Procedure(s) with comments: INCISION AND DRAINAGE, ABSCESS, PERIRECTAL (Left) - INCISION AND DRAINAGE OF LEFT GLUTEAL CLEFT/PERINEAL ABSCESS UNDER ANESTHESIA as a surgical intervention.  The patient's history has been reviewed, patient examined, no change in status, stable for surgery.  I have reviewed the patient's chart and labs.  Questions were answered to the patient's satisfaction.   Pt seen examined and agree I/D in OR today The procedure has been discussed with the patient.  Alternative therapies have been discussed with the patient.  Operative risks include bleeding,  Infection,  Organ injury,  Nerve injury,  Blood vessel injury,  DVT,  Pulmonary embolism,  Death,  And possible reoperation.  Medical management risks include worsening of present situation.  The success of the procedure is 50 -90 % at treating patients symptoms.  The patient understands and agrees to proceed.   Mckay Brandt A [place] Coldren

## 2023-11-14 NOTE — Progress Notes (Signed)
 Patient c/o dizziness when standing and walking around this morning. Patient appear to be diaphoretic. Patient says because he is not use to taking medications so it may have caused the dizziness, as well as his NPO diet. He denies and N/V, or nervousness related to the upcoming procedure.CBG 88, vitals WDL. Applied damp cool wash cloth on forehead and educated him on standing slowly and to report any other symptoms.

## 2023-11-14 NOTE — Plan of Care (Signed)
  Problem: Education: Goal: Knowledge of General Education information will improve Description: Including pain rating scale, medication(s)/side effects and non-pharmacologic comfort measures Outcome: Progressing   Problem: Coping: Goal: Level of anxiety will decrease Outcome: Progressing   Problem: Pain Managment: Goal: General experience of comfort will improve and/or be controlled Outcome: Progressing

## 2023-11-14 NOTE — Anesthesia Preprocedure Evaluation (Addendum)
 Anesthesia Evaluation  Patient identified by MRN, date of birth, ID band Patient awake    Reviewed: Allergy & Precautions, NPO status , Patient's Chart, lab work & pertinent test results  Airway Mallampati: II       Dental no notable dental hx. (+) Teeth Intact, Dental Advisory Given   Pulmonary Patient abstained from smoking., former smoker   Pulmonary exam normal breath sounds clear to auscultation       Cardiovascular Normal cardiovascular exam Rhythm:Regular Rate:Normal  Hx/o cardiac surgery age 33 hole in heart repaired   Neuro/Psych negative neurological ROS  negative psych ROS   GI/Hepatic Neg liver ROS,,,Perirectal abscess   Endo/Other  negative endocrine ROS    Renal/GU negative Renal ROS  negative genitourinary   Musculoskeletal negative musculoskeletal ROS (+)    Abdominal   Peds  Hematology negative hematology ROS (+)   Anesthesia Other Findings   Reproductive/Obstetrics                              Anesthesia Physical Anesthesia Plan  ASA: 2  Anesthesia Plan: General   Post-op Pain Management: Dilaudid  IV   Induction: Intravenous  PONV Risk Score and Plan: 3 and Treatment may vary due to age or medical condition, Dexamethasone  and Ondansetron   Airway Management Planned: Oral ETT  Additional Equipment: None  Intra-op Plan:   Post-operative Plan: Extubation in OR  Informed Consent: I have reviewed the patients History and Physical, chart, labs and discussed the procedure including the risks, benefits and alternatives for the proposed anesthesia with the patient or authorized representative who has indicated his/her understanding and acceptance.     Dental advisory given  Plan Discussed with: CRNA and Anesthesiologist  Anesthesia Plan Comments:          Anesthesia Quick Evaluation

## 2023-11-14 NOTE — Transfer of Care (Signed)
 Immediate Anesthesia Transfer of Care Note  Patient: Shawn Dunlap  Procedure(s) Performed: INCISION AND DRAINAGE, ABSCESS, PERIRECTAL (Left)  Patient Location: PACU  Anesthesia Type:General  Level of Consciousness: drowsy  Airway & Oxygen Therapy: Patient Spontanous Breathing and Patient connected to nasal cannula oxygen  Post-op Assessment: Report given to RN and Post -op Vital signs reviewed and stable  Post vital signs: Reviewed and stable  Last Vitals:  Vitals Value Taken Time  BP 124/69 11/14/23 10:10  Temp    Pulse 82 11/14/23 10:13  Resp 27 11/14/23 10:13  SpO2 96 % 11/14/23 10:13  Vitals shown include unfiled device data.  Last Pain:  Vitals:   11/14/23 0827  TempSrc:   PainSc: 1          Complications: No notable events documented.

## 2023-11-14 NOTE — Op Note (Signed)
 Preoperative diagnosis: Left peroneal abscess  Postoperative diagnosis: Same  Procedure: Incision and drainage left ischial rectal/perineal abscess  Surgeon: Debby Shipper, MD  Esthesia: LMA with 0.25% Marcaine  with epinephrine   EBL: Minimal  Specimen cultures  Drains none  Indications for procedure: The patient has a left perineal/ischial rectal fossa abscess at the base of the left hemiscrotum.  He presents today for incision and drainage.  Films were reviewed.  All questions were answered.The procedure has been discussed with the patient.  Alternative therapies have been discussed with the patient.  Operative risks include bleeding,  Infection,  Organ injury,  Nerve injury,  Blood vessel injury,  DVT,  Pulmonary embolism,  Death,  And possible reoperation.  Medical management risks include worsening of present situation.  The success of the procedure is 50 -90 % at treating patients symptoms.  The patient understands and agrees to proceed.    Description of procedure: The patient was met in the holding area and all questions were answered.  He was taken back to the operative room placed supine.  After induction of general anesthesia, he is placed in lithotomy and padded appropriately.  He was then prepped and draped in sterile fashion.  Timeout performed.  Films were available for review.  On examination an area of fluctuance was noted in the left perineum at the base of his left scrotum.  This is about 6 cm from the anal verge.  This was opened in a cruciate fashion for time out and pus was removed.  The cavity is about 3 cm in maximal diameter and packed with half-inch iodoform packing.  Hemostasis achieved.  Mesh panties and packing covered by gauze.  All counts were found to be correct.  He was taken out of lithotomy extubated taken recovery in satisfactory condition.

## 2023-11-14 NOTE — Anesthesia Postprocedure Evaluation (Signed)
 Anesthesia Post Note  Patient: NIVEK POWLEY  Procedure(s) Performed: INCISION AND DRAINAGE, ABSCESS, PERIRECTAL (Left)     Patient location during evaluation: PACU Anesthesia Type: General Level of consciousness: awake and alert and oriented Pain management: pain level controlled Vital Signs Assessment: post-procedure vital signs reviewed and stable Respiratory status: spontaneous breathing, nonlabored ventilation and respiratory function stable Cardiovascular status: blood pressure returned to baseline and stable Postop Assessment: no apparent nausea or vomiting Anesthetic complications: no   No notable events documented.  Last Vitals:  Vitals:   11/14/23 1040 11/14/23 1100  BP: 115/62 109/68  Pulse: 70 73  Resp: 14 14  Temp: 36.9 C 36.7 C  SpO2: 98% 100%                  Shantara Goosby A.

## 2023-11-15 ENCOUNTER — Encounter (HOSPITAL_COMMUNITY): Payer: Self-pay | Admitting: Surgery

## 2023-11-17 ENCOUNTER — Ambulatory Visit: Admitting: Family Medicine

## 2023-11-17 LAB — AEROBIC/ANAEROBIC CULTURE W GRAM STAIN (SURGICAL/DEEP WOUND): Culture: NO GROWTH

## 2023-11-18 ENCOUNTER — Other Ambulatory Visit: Payer: Self-pay

## 2023-11-18 ENCOUNTER — Emergency Department (HOSPITAL_BASED_OUTPATIENT_CLINIC_OR_DEPARTMENT_OTHER)
Admission: EM | Admit: 2023-11-18 | Discharge: 2023-11-18 | Disposition: A | Discharge status: Home / Self Care | Source: Ambulatory Visit | Attending: Emergency Medicine | Admitting: Emergency Medicine

## 2023-11-18 ENCOUNTER — Emergency Department (HOSPITAL_BASED_OUTPATIENT_CLINIC_OR_DEPARTMENT_OTHER)

## 2023-11-18 DIAGNOSIS — L03315 Cellulitis of perineum: Secondary | ICD-10-CM | POA: Insufficient documentation

## 2023-11-18 DIAGNOSIS — Z87891 Personal history of nicotine dependence: Secondary | ICD-10-CM | POA: Insufficient documentation

## 2023-11-18 DIAGNOSIS — R000 Tachycardia, unspecified: Secondary | ICD-10-CM | POA: Insufficient documentation

## 2023-11-18 DIAGNOSIS — R509 Fever, unspecified: Secondary | ICD-10-CM | POA: Diagnosis present

## 2023-11-18 LAB — CBC WITH DIFFERENTIAL/PLATELET
Abs Immature Granulocytes: 0.09 K/uL — ABNORMAL HIGH (ref 0.00–0.07)
Basophils Absolute: 0 K/uL (ref 0.0–0.1)
Basophils Relative: 0 %
Eosinophils Absolute: 0 K/uL (ref 0.0–0.5)
Eosinophils Relative: 0 %
HCT: 50.6 % (ref 39.0–52.0)
Hemoglobin: 16.9 g/dL (ref 13.0–17.0)
Immature Granulocytes: 2 %
Lymphocytes Relative: 8 %
Lymphs Abs: 0.5 K/uL — ABNORMAL LOW (ref 0.7–4.0)
MCH: 30.2 pg (ref 26.0–34.0)
MCHC: 33.4 g/dL (ref 30.0–36.0)
MCV: 90.5 fL (ref 80.0–100.0)
Monocytes Absolute: 0.5 K/uL (ref 0.1–1.0)
Monocytes Relative: 8 %
Neutro Abs: 5 K/uL (ref 1.7–7.7)
Neutrophils Relative %: 82 %
Platelets: 260 K/uL (ref 150–400)
RBC: 5.59 MIL/uL (ref 4.22–5.81)
RDW: 11.3 % — ABNORMAL LOW (ref 11.5–15.5)
WBC: 6.1 K/uL (ref 4.0–10.5)
nRBC: 0 % (ref 0.0–0.2)

## 2023-11-18 LAB — URINALYSIS, W/ REFLEX TO CULTURE (INFECTION SUSPECTED)
Bacteria, UA: NONE SEEN
Bilirubin Urine: NEGATIVE
Glucose, UA: NEGATIVE mg/dL
Hgb urine dipstick: NEGATIVE
Ketones, ur: NEGATIVE mg/dL
Leukocytes,Ua: NEGATIVE
Nitrite: NEGATIVE
Specific Gravity, Urine: >1.046 — ABNORMAL HIGH (ref 1.005–1.030)
pH: 5.5 (ref 5.0–8.0)

## 2023-11-18 LAB — COMPREHENSIVE METABOLIC PANEL WITH GFR
ALT: 27 U/L (ref 0–44)
AST: 20 U/L (ref 15–41)
Albumin: 4.8 g/dL (ref 3.5–5.0)
Alkaline Phosphatase: 54 U/L (ref 38–126)
Anion gap: 14 (ref 5–15)
BUN: 11 mg/dL (ref 6–20)
CO2: 26 mmol/L (ref 22–32)
Calcium: 10.1 mg/dL (ref 8.9–10.3)
Chloride: 102 mmol/L (ref 98–111)
Creatinine, Ser: 1.17 mg/dL (ref 0.61–1.24)
GFR, Estimated: >60 mL/min (ref >60)
Glucose, Bld: 108 mg/dL — ABNORMAL HIGH (ref 70–99)
Potassium: 3.8 mmol/L (ref 3.5–5.1)
Sodium: 141 mmol/L (ref 135–145)
Total Bilirubin: 0.5 mg/dL (ref 0.0–1.2)
Total Protein: 8 g/dL (ref 6.5–8.1)

## 2023-11-18 LAB — PROTIME-INR
INR: 0.9 (ref 0.8–1.2)
Prothrombin Time: 13.1 s (ref 11.4–15.2)

## 2023-11-18 LAB — RESP PANEL BY RT-PCR (RSV, FLU A&B, COVID)  RVPGX2
Influenza A by PCR: NEGATIVE
Influenza B by PCR: NEGATIVE
Resp Syncytial Virus by PCR: NEGATIVE
SARS Coronavirus 2 by RT PCR: NEGATIVE

## 2023-11-18 LAB — LACTIC ACID, PLASMA: Lactic Acid, Venous: 0.9 mmol/L (ref 0.5–1.9)

## 2023-11-18 MED ORDER — ONDANSETRON HCL 4 MG PO TABS: 4.0000 mg | ORAL_TABLET | Freq: Four times a day (QID) | ORAL | 0 refills | Status: AC

## 2023-11-18 MED ORDER — CEFUROXIME AXETIL 500 MG PO TABS: 500.0000 mg | ORAL_TABLET | Freq: Two times a day (BID) | ORAL | 0 refills | Status: AC

## 2023-11-18 MED ORDER — SODIUM CHLORIDE 0.9 % IV SOLN
2.0000 g | Freq: Once | INTRAVENOUS | Status: AC
  Administered 2023-11-18: 2 g via INTRAVENOUS
  Filled 2023-11-18: qty 20

## 2023-11-18 MED ORDER — LACTATED RINGERS IV BOLUS (SEPSIS)
1000.0000 mL | Freq: Once | INTRAVENOUS | Status: AC
  Administered 2023-11-18: 1000 mL via INTRAVENOUS

## 2023-11-18 MED ORDER — METRONIDAZOLE 500 MG/100ML IV SOLN
500.0000 mg | Freq: Once | INTRAVENOUS | Status: AC
  Administered 2023-11-18: 500 mg via INTRAVENOUS
  Filled 2023-11-18: qty 100

## 2023-11-18 MED ORDER — CEFUROXIME AXETIL 250 MG PO TABS: 500.0000 mg | ORAL_TABLET | Freq: Two times a day (BID) | ORAL | Status: DC

## 2023-11-18 MED ORDER — SULFAMETHOXAZOLE-TRIMETHOPRIM 800-160 MG PO TABS: 1.0000 | ORAL_TABLET | Freq: Two times a day (BID) | ORAL | 0 refills | Status: AC

## 2023-11-18 MED ORDER — KETOROLAC TROMETHAMINE 15 MG/ML IJ SOLN
15.0000 mg | Freq: Once | INTRAMUSCULAR | Status: AC
  Administered 2023-11-18: 15 mg via INTRAVENOUS
  Filled 2023-11-18: qty 1

## 2023-11-18 MED ORDER — IOHEXOL 300 MG/ML  SOLN
100.0000 mL | Freq: Once | INTRAMUSCULAR | Status: AC | PRN
  Administered 2023-11-18: 100 mL via INTRAVENOUS

## 2023-11-18 MED ORDER — OXYCODONE-ACETAMINOPHEN 5-325 MG PO TABS
1.0000 | ORAL_TABLET | Freq: Once | ORAL | Status: AC
  Administered 2023-11-18: 1 via ORAL
  Filled 2023-11-18: qty 1

## 2023-11-18 MED ORDER — SULFAMETHOXAZOLE-TRIMETHOPRIM 800-160 MG PO TABS
1.0000 | ORAL_TABLET | Freq: Once | ORAL | Status: AC
  Administered 2023-11-18: 1 via ORAL
  Filled 2023-11-18: qty 1

## 2023-11-18 MED ORDER — HYDROMORPHONE HCL 1 MG/ML IJ SOLN
1.0000 mg | Freq: Once | INTRAMUSCULAR | Status: DC
  Filled 2023-11-18: qty 1

## 2023-11-18 MED ORDER — LACTATED RINGERS IV SOLN
INTRAVENOUS | Status: DC
Start: 2023-11-18 — End: 2023-11-19

## 2023-11-18 MED ORDER — METOCLOPRAMIDE HCL 5 MG/ML IJ SOLN
10.0000 mg | Freq: Once | INTRAMUSCULAR | Status: AC
  Administered 2023-11-18: 10 mg via INTRAVENOUS
  Filled 2023-11-18: qty 2

## 2023-11-18 NOTE — Discharge Instructions (Addendum)
 While you were in the emergency room, you had blood work done that overall was normal.  You still have some lingering infection in your groin.  This needs to be treated with an oral antibiotic.  We are going to give you some stronger antibiotics than you have had previously.  I am sending you with a prescription for Bactrim.  You may take this once in the morning and once in the evening.  You may also take Ceftin once in the morning and once in the evening.  You have received these antibiotics here in the emergency room.  You may begin taking them again tomorrow.  You should begin to notice an improvement within 48 hours.  Return to the emergency room if you develop worsening pain, noticed any drainage, or swelling of your scrotum.

## 2023-11-18 NOTE — Discharge Summary (Addendum)
 [facility] Discharge Summary   Patient ID: Shawn Dunlap MRN: [id number] DOB/AGE: 08/06/1990 33 y.o.  Admit date: 11/13/2023 Discharge date: 11/14/2023  Admitting Diagnosis: Perineal/perirectal abscess  Discharge Diagnosis Patient Active Problem List   Diagnosis Date Noted   Perineal abscess 11/13/2023   Perirectal abscess 11/13/2023   Consultants General surgery  Imaging: CT Pelvis W Contrast 11/13/2023  Procedures Dr. Debby Cornett (11/14/23) - Incision and drainage left ischial rectal/perineal abscess   Hospital Course:  Shawn Dunlap is a 33 year old male who presented to the ED with concern of know perineal/perirectal abscess. Workup showed elongated rim enhancing subcutaneous fluid collection at the left aspect of the gluteal cleft and perineum, anterior to the anus, measuring 4.9 x 2.0 x 4.1 cm with possible fistula.  Patient was admitted and underwent procedure listed above. Tolerated procedure well and was transferred to the floor.  Diet was advanced as tolerated. On POD0, the patient was voiding well, tolerating diet, ambulating well, pain well controlled, vital signs stable, and felt stable for discharge home.  Patient will follow up in our office and knows to call with questions or concerns.  He will call to confirm appointment date/time.     I or a member of my team have reviewed this patient in the Controlled Substance Database.   Allergies as of 11/14/2023   No Known Allergies      Medication List     STOP taking these medications    amoxicillin -clavulanate 875-125 MG tablet Commonly known as: AUGMENTIN        TAKE these medications    acetaminophen  500 MG tablet Commonly known as: TYLENOL  Take 1 tablet (500 mg total) by mouth every 6 (six) hours as needed.   ibuprofen  800 MG tablet Commonly known as: ADVIL  Take 1 tablet (800 mg total) by mouth every 8 (eight) hours as needed (pain).   oxyCODONE  5 MG immediate release  tablet Commonly known as: Oxy IR/ROXICODONE  Take 1 tablet (5 mg total) by mouth every 6 (six) hours as needed for moderate pain (pain score 4-6) or severe pain (pain score 7-10).          Follow-up Information     Tower, Laine LABOR, MD Follow up.   Specialties: Family Medicine, Radiology Why: Monday 29th 4:00pm with Dr Randeen at [facility] information: [address] [place] [address] [phone]         Maczis, Tonja Barban, PA-C. Call on 12/02/2023.   Specialty: General Surgery Why: The office is scheduling you a follow up visit that will be in about 2 weeks (10/14 at 4:15).  Please call to confirm your appointmet. Arrive 30 minutes prior to scheduled appointment time. Please bring your ID and insurance cards. Contact information: [address] [place] [address] [facility] [place] [address] [phone]                 Signed: Marjorie Carlyon Favre , [facility] 11/18/2023, 1:19 PM Please see Amion for pager number during day hours 7:00am-4:30pm

## 2023-11-18 NOTE — ED Triage Notes (Signed)
 Pt POV reporting n/v/d and fever that began today, I&D of perirectal abscess 9/26, advised to come to ED for evaluation

## 2023-11-18 NOTE — Sepsis Progress Note (Signed)
 eLink is following this Code Sepsis.

## 2023-11-18 NOTE — ED Provider Notes (Signed)
 [facility] EMERGENCY DEPARTMENT AT [facility] Provider Note  CSN: [id number] Arrival date & time: 11/18/23 1523  Chief Complaint(s) Emesis and Diarrhea  HPI Shawn Dunlap is a 33 y.o. male this is a 33 year old male who is here today for nausea, vomiting, diarrhea and fever that began today.  Patient had an I&D of a perirectal abscess in the OR on 9/26.  Was not discharged with antibiotics.  He says that he woke up this morning had a fever, abdominal pain, nausea vomiting diarrhea.   Past Medical History No past medical history on file. Patient Active Problem List   Diagnosis Date Noted   Perineal abscess 11/13/2023   Perirectal abscess 11/13/2023   Home Medication(s) Prior to Admission medications   Medication Sig Start Date End Date Taking? Authorizing Provider  cefUROXime (CEFTIN) 500 MG tablet Take 1 tablet (500 mg total) by mouth 2 (two) times daily with a meal for 10 days. 11/18/23 11/28/23 Yes Mannie Pac T, DO  ondansetron  (ZOFRAN ) 4 MG tablet Take 1 tablet (4 mg total) by mouth every 6 (six) hours. 11/18/23  Yes Mannie Pac T, DO  sulfamethoxazole-trimethoprim (BACTRIM DS) 800-160 MG tablet Take 1 tablet by mouth 2 (two) times daily for 10 days. 11/18/23 11/28/23 Yes Mannie Pac T, DO  acetaminophen  (TYLENOL ) 500 MG tablet Take 1 tablet (500 mg total) by mouth every 6 (six) hours as needed. 11/14/23   Hogan, Kendra Paige, PA-C  ibuprofen  (ADVIL ) 800 MG tablet Take 1 tablet (800 mg total) by mouth every 8 (eight) hours as needed (pain). 11/11/23   Vonna Sharlet POUR, MD  oxyCODONE  (OXY IR/ROXICODONE ) 5 MG immediate release tablet Take 1 tablet (5 mg total) by mouth every 6 (six) hours as needed for moderate pain (pain score 4-6) or severe pain (pain score 7-10). 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   INCISION AND DRAINAGE PERIRECTAL ABSCESS Left 11/14/2023   Procedure: INCISION AND DRAINAGE, ABSCESS, PERIRECTAL;  Surgeon: Vanderbilt Ned, MD;  Location: [facility] OR;  Service: General;  Laterality: Left;  INCISION AND DRAINAGE OF LEFT GLUTEAL CLEFT/PERINEAL ABSCESS UNDER ANESTHESIA   Family History Family History  Problem Relation Age of Onset   Healthy Mother     Social History Social History   Tobacco Use   Smoking status: Former    Types: Cigarettes   Smokeless tobacco: Never  Vaping Use   Vaping status: Every Day   Substances: Nicotine, Flavoring  Substance Use Topics   Alcohol use: Not Currently   Drug use: No   Allergies Patient has no known  allergies.  Review of Systems Review of Systems  Physical Exam Vital Signs  I have reviewed the triage vital signs BP 117/73   Pulse 70   Temp 100.2 F (37.9 C) (Oral)   Resp 19   Ht 5' 10 (1.778 m)   Wt 86.2 kg   SpO2 94%   BMI 27.26 kg/m   Physical Exam Vitals and nursing note reviewed.  Constitutional:      Appearance: He is toxic-appearing.  HENT:     Head: Normocephalic and atraumatic.  Eyes:     Pupils: Pupils are equal, round, and reactive to light.  Cardiovascular:     Rate and Rhythm: Tachycardia present.  Pulmonary:     Effort: Pulmonary effort is normal. No respiratory distress.     Breath sounds: Normal breath sounds. No wheezing.  Abdominal:     General: Abdomen is flat.     Palpations: Abdomen is soft.     Tenderness: There is abdominal tenderness.  Genitourinary:    Penis: Normal.      Testes: Normal.     Comments: Clean, open incision in the left groin, no purulent drainage, no surrounding crepitus or erythema. Musculoskeletal:        General: Normal range of motion.     Cervical back: Normal range of motion.  Skin:    General: Skin is warm.  Neurological:     General: No  focal deficit present.     Mental Status: He is alert.     ED Results and Treatments Labs (all labs ordered are listed, but only abnormal results are displayed) Labs Reviewed  COMPREHENSIVE METABOLIC PANEL WITH GFR - Abnormal; Notable for the following components:      Result Value   Glucose, Bld 108 (*)    All other components within normal limits  CBC WITH DIFFERENTIAL/PLATELET - Abnormal; Notable for the following components:   RDW 11.3 (*)    Lymphs Abs 0.5 (*)    Abs Immature Granulocytes 0.09 (*)    All other components within normal limits  URINALYSIS, W/ REFLEX TO CULTURE (INFECTION SUSPECTED) - Abnormal; Notable for the following components:   Specific Gravity, Urine >1.046 (*)    Protein, ur TRACE (*)    All other components within normal limits  RESP PANEL BY RT-PCR (RSV, FLU A&B, COVID)  RVPGX2  CULTURE, BLOOD (ROUTINE X 2)  CULTURE, BLOOD (ROUTINE X 2)  LACTIC ACID, PLASMA  PROTIME-INR  LACTIC ACID, PLASMA                                                                                                                          Radiology CT ABDOMEN PELVIS W CONTRAST Result Date: 11/18/2023 CLINICAL DATA:  History of recent perirectal abscess I and D, presenting with fever with nausea, vomiting and diarrhea. EXAM: CT ABDOMEN AND PELVIS WITH CONTRAST TECHNIQUE: Multidetector CT imaging of the abdomen and pelvis was performed using the standard protocol following bolus administration of intravenous contrast.  RADIATION DOSE REDUCTION: This exam was performed according to the departmental dose-optimization program which includes automated exposure control, adjustment of the mA and/or kV according to patient size and/or use of iterative reconstruction technique. CONTRAST:  OMNIPAQUE  IOHEXOL  300 MG/ML  SOLN COMPARISON:  November 13, 2023 FINDINGS: Lower chest: No acute abnormality. Hepatobiliary: No focal liver abnormality is seen. No gallstones, gallbladder wall thickening,  or biliary dilatation. Pancreas: Unremarkable. No pancreatic ductal dilatation or surrounding inflammatory changes. Spleen: Normal in size without focal abnormality. Adrenals/Urinary Tract: Adrenal glands are unremarkable. Kidneys are normal, without renal calculi, focal lesion, or hydronephrosis. Bladder is unremarkable. Stomach/Bowel: Stomach is within normal limits. Appendix appears normal. No evidence of bowel wall thickening, distention, or inflammatory changes. Vascular/Lymphatic: No significant vascular findings are present. No enlarged abdominal or pelvic lymph nodes. Reproductive: Prostate is unremarkable. Other: A very thin (approximately 3 mm), 2.1 cm long area of subcutaneous inflammatory fat stranding is seen anterior to the anus, along the gluteal cleft and perineum on the left. The subcutaneous fluid collection seen within this region on the prior study is no longer present. No abdominopelvic ascites. Musculoskeletal: No acute or significant osseous findings. IMPRESSION: Very thin area of cellulitis within the region anterior to the anus on the left, along the gluteal cleft and perineum. Electronically Signed   By: Suzen Dials M.D.   On: 11/18/2023 18:53   DG Chest Port 1 View Result Date: 11/18/2023 CLINICAL DATA:  Fever. EXAM: PORTABLE CHEST 1 VIEW COMPARISON:  None Available. FINDINGS: The heart size and mediastinal contours are within normal limits. Sternotomy wires are noted. Both lungs are clear. The visualized skeletal structures are unremarkable. IMPRESSION: No active disease. Electronically Signed   By: Lynwood Landy Raddle M.D.   On: 11/18/2023 16:12    Pertinent labs & imaging results that were available during my care of the patient were reviewed by me and considered in my medical decision making (see MDM for details).  Medications Ordered in ED Medications  lactated ringers  infusion (0 mLs Intravenous Stopped 11/18/23 1911)  sulfamethoxazole-trimethoprim (BACTRIM DS) 800-160 MG  per tablet 1 tablet (has no administration in time range)  cefUROXime (CEFTIN) tablet 500 mg (has no administration in time range)  lactated ringers  bolus 1,000 mL (0 mLs Intravenous Stopped 11/18/23 1854)    And  lactated ringers  bolus 1,000 mL (0 mLs Intravenous Stopped 11/18/23 1650)    And  lactated ringers  bolus 1,000 mL (0 mLs Intravenous Stopped 11/18/23 1726)  cefTRIAXone  (ROCEPHIN ) 2 g in sodium chloride  0.9 % 100 mL IVPB (0 g Intravenous Stopped 11/18/23 1647)  metroNIDAZOLE  (FLAGYL ) IVPB 500 mg (0 mg Intravenous Stopped 11/18/23 1756)  metoCLOPramide (REGLAN) injection 10 mg (10 mg Intravenous Given 11/18/23 1617)  iohexol  (OMNIPAQUE ) 300 MG/ML solution 100 mL (100 mLs Intravenous Contrast Given 11/18/23 1803)  oxyCODONE -acetaminophen  (PERCOCET/ROXICET) 5-325 MG per tablet 1 tablet (1 tablet Oral Given 11/18/23 1851)  ketorolac (TORADOL) 15 MG/ML injection 15 mg (15 mg Intravenous Given 11/18/23 2004)  Procedures Procedures  (including critical care time)  Medical Decision Making / ED Course   This patient presents to the ED for concern of postoperative fever, this involves an extensive number of treatment options, and is a complaint that carries with it a high risk of complications and morbidity.  The differential diagnosis includes intra-abdominal infection, cystitis, pneumonia, sepsis, bacteremia.  Viral syndrome.  MDM: Patient's incision clean appearing.  He is having abdominal pain and tenderness.  Will initiate septic workup, obtain imaging of the patient's abdomen pelvis.  Broad-spectrum antibiotics ordered.  Will also obtain a chest x-ray and a urine on the patient.  Reassessment 8:30 PM-patient's lactic acid not elevated, no leukocytosis.  Has been borderline febrile here in the ED, otherwise no signs of systemic infection.  His CT imaging shows no  recurrence of fluid collection, no intra-abdominal infection.  His chest x-ray shows no pneumonia, his urine has no infection.  CT imaging does show continued perineum cellulitis.  Given that the patient has not been on an antibiotic for this, and he does not appear septic, I believe it is appropriate to treat him with oral antibiotics.  Will treat patient with Bactrim and Keflex .  Return precautions were discussed with patient at bedside.  Will discharge.   Additional history obtained: -Additional history obtained from partner at bedside -External records from outside source obtained and reviewed including: Chart review including previous notes, labs, imaging, consultation notes   Lab Tests: -I ordered, reviewed, and interpreted labs.   The pertinent results include:   Labs Reviewed  COMPREHENSIVE METABOLIC PANEL WITH GFR - Abnormal; Notable for the following components:      Result Value   Glucose, Bld 108 (*)    All other components within normal limits  CBC WITH DIFFERENTIAL/PLATELET - Abnormal; Notable for the following components:   RDW 11.3 (*)    Lymphs Abs 0.5 (*)    Abs Immature Granulocytes 0.09 (*)    All other components within normal limits  URINALYSIS, W/ REFLEX TO CULTURE (INFECTION SUSPECTED) - Abnormal; Notable for the following components:   Specific Gravity, Urine >1.046 (*)    Protein, ur TRACE (*)    All other components within normal limits  RESP PANEL BY RT-PCR (RSV, FLU A&B, COVID)  RVPGX2  CULTURE, BLOOD (ROUTINE X 2)  CULTURE, BLOOD (ROUTINE X 2)  LACTIC ACID, PLASMA  PROTIME-INR  LACTIC ACID, PLASMA      EKG my independent review of the patient's EKG shows no ST segment depressions or elevations, no T wave inversions, no evidence of acute ischemia.  EKG Interpretation Date/Time:    Ventricular Rate:    PR Interval:    QRS Duration:    QT Interval:    QTC Calculation:   R Axis:      Text Interpretation:           Imaging Studies  ordered: I ordered imaging studies including CT abdomen pelvis, chest x-ray I independently visualized and interpreted imaging. I agree with the radiologist interpretation   Medicines ordered and prescription drug management: Meds ordered this encounter  Medications   lactated ringers  infusion   AND Linked Order Group    lactated ringers  bolus 1,000 mL     Total Body Weight basis for 30 mL/kg  bolus delivery:   86.2 kg    lactated ringers  bolus 1,000 mL     Total Body Weight basis for 30 mL/kg  bolus delivery:   86.2 kg    lactated ringers  bolus  1,000 mL     Total Body Weight basis for 30 mL/kg  bolus delivery:   86.2 kg   cefTRIAXone  (ROCEPHIN ) 2 g in sodium chloride  0.9 % 100 mL IVPB    Antibiotic Indication::   Intra-abdominal   metroNIDAZOLE  (FLAGYL ) IVPB 500 mg    Antibiotic Indication::   Intra-abdominal Infection   metoCLOPramide (REGLAN) injection 10 mg   iohexol  (OMNIPAQUE ) 300 MG/ML solution 100 mL   DISCONTD: HYDROmorphone  (DILAUDID ) injection 1 mg   oxyCODONE -acetaminophen  (PERCOCET/ROXICET) 5-325 MG per tablet 1 tablet    Refill:  0   ketorolac (TORADOL) 15 MG/ML injection 15 mg   sulfamethoxazole-trimethoprim (BACTRIM DS) 800-160 MG per tablet 1 tablet   cefUROXime (CEFTIN) tablet 500 mg   cefUROXime (CEFTIN) 500 MG tablet    Sig: Take 1 tablet (500 mg total) by mouth 2 (two) times daily with a meal for 10 days.    Dispense:  20 tablet    Refill:  0   sulfamethoxazole-trimethoprim (BACTRIM DS) 800-160 MG tablet    Sig: Take 1 tablet by mouth 2 (two) times daily for 10 days.    Dispense:  20 tablet    Refill:  0   ondansetron  (ZOFRAN ) 4 MG tablet    Sig: Take 1 tablet (4 mg total) by mouth every 6 (six) hours.    Dispense:  12 tablet    Refill:  0    -I have reviewed the patients home medicines and have made adjustments as needed  Cardiac Monitoring: The patient was maintained on a cardiac monitor.  I personally viewed and interpreted the cardiac monitored  which showed an underlying rhythm of: Normal sinus rhythm  Social Determinants of Health:  Factors impacting patients care include: Lack of access to primary care   Reevaluation: After the interventions noted above, I reevaluated the patient and found that they have :improved  Co morbidities that complicate the patient evaluation No past medical history on file.    Dispostion: I considered admission for this patient, however given his workup I believe it is appropriate to treat him with outpatient antibiotics.     Final Clinical Impression(s) / ED Diagnoses Final diagnoses:  Cellulitis of perineum     @PCDICTATION @    Mannie Pac T, DO 11/18/23 2043

## 2023-11-23 LAB — CULTURE, BLOOD (ROUTINE X 2)
Culture: NO GROWTH
Culture: NO GROWTH
Special Requests: ADEQUATE
Special Requests: ADEQUATE
